# Patient Record
Sex: Male | Born: 1985 | Race: White | Hispanic: No | Marital: Married | Smoking: Never smoker
Health system: Southern US, Community
[De-identification: ages and names within clinical notes are randomized; demographics above are authoritative.]

## PROBLEM LIST (undated history)

## (undated) DIAGNOSIS — I639 Cerebral infarction, unspecified: Secondary | ICD-10-CM

## (undated) DIAGNOSIS — J45909 Unspecified asthma, uncomplicated: Secondary | ICD-10-CM

## (undated) HISTORY — PX: MANDIBLE SURGERY: SHX707

---

## 2021-01-04 ENCOUNTER — Other Ambulatory Visit: Payer: Self-pay

## 2021-01-04 ENCOUNTER — Emergency Department (HOSPITAL_COMMUNITY): Payer: BC Managed Care – PPO

## 2021-01-04 ENCOUNTER — Encounter (HOSPITAL_COMMUNITY): Payer: Self-pay | Admitting: Emergency Medicine

## 2021-01-04 ENCOUNTER — Inpatient Hospital Stay (HOSPITAL_COMMUNITY)
Admission: EM | Admit: 2021-01-04 | Discharge: 2021-01-06 | DRG: 065 | Disposition: A | Discharge status: Home / Self Care | Payer: BC Managed Care – PPO | Attending: Internal Medicine | Admitting: Internal Medicine

## 2021-01-04 DIAGNOSIS — I10 Essential (primary) hypertension: Secondary | ICD-10-CM | POA: Diagnosis present

## 2021-01-04 DIAGNOSIS — I639 Cerebral infarction, unspecified: Secondary | ICD-10-CM | POA: Diagnosis not present

## 2021-01-04 DIAGNOSIS — K219 Gastro-esophageal reflux disease without esophagitis: Secondary | ICD-10-CM | POA: Diagnosis present

## 2021-01-04 DIAGNOSIS — R471 Dysarthria and anarthria: Secondary | ICD-10-CM | POA: Diagnosis not present

## 2021-01-04 DIAGNOSIS — Z79899 Other long term (current) drug therapy: Secondary | ICD-10-CM

## 2021-01-04 DIAGNOSIS — Z20822 Contact with and (suspected) exposure to covid-19: Secondary | ICD-10-CM | POA: Diagnosis not present

## 2021-01-04 DIAGNOSIS — Q2112 Patent foramen ovale: Secondary | ICD-10-CM

## 2021-01-04 DIAGNOSIS — R29701 NIHSS score 1: Secondary | ICD-10-CM | POA: Diagnosis present

## 2021-01-04 DIAGNOSIS — R4781 Slurred speech: Secondary | ICD-10-CM | POA: Diagnosis not present

## 2021-01-04 DIAGNOSIS — R29898 Other symptoms and signs involving the musculoskeletal system: Secondary | ICD-10-CM | POA: Diagnosis present

## 2021-01-04 DIAGNOSIS — Z9889 Other specified postprocedural states: Secondary | ICD-10-CM | POA: Diagnosis not present

## 2021-01-04 DIAGNOSIS — J4520 Mild intermittent asthma, uncomplicated: Secondary | ICD-10-CM

## 2021-01-04 DIAGNOSIS — Z7982 Long term (current) use of aspirin: Secondary | ICD-10-CM

## 2021-01-04 DIAGNOSIS — E7849 Other hyperlipidemia: Secondary | ICD-10-CM | POA: Diagnosis not present

## 2021-01-04 DIAGNOSIS — I6389 Other cerebral infarction: Secondary | ICD-10-CM | POA: Diagnosis not present

## 2021-01-04 DIAGNOSIS — E785 Hyperlipidemia, unspecified: Secondary | ICD-10-CM | POA: Diagnosis not present

## 2021-01-04 DIAGNOSIS — R569 Unspecified convulsions: Secondary | ICD-10-CM | POA: Diagnosis not present

## 2021-01-04 DIAGNOSIS — R9431 Abnormal electrocardiogram [ECG] [EKG]: Secondary | ICD-10-CM | POA: Diagnosis not present

## 2021-01-04 DIAGNOSIS — J45909 Unspecified asthma, uncomplicated: Secondary | ICD-10-CM | POA: Diagnosis not present

## 2021-01-04 HISTORY — DX: Unspecified asthma, uncomplicated: J45.909

## 2021-01-04 LAB — CBC
HCT: 44.7 % (ref 39.0–52.0)
Hemoglobin: 14.4 g/dL (ref 13.0–17.0)
MCH: 26.4 pg (ref 26.0–34.0)
MCHC: 32.2 g/dL (ref 30.0–36.0)
MCV: 81.9 fL (ref 80.0–100.0)
Platelets: 301 10*3/uL (ref 150–400)
RBC: 5.46 MIL/uL (ref 4.22–5.81)
RDW: 12.4 % (ref 11.5–15.5)
WBC: 6.7 10*3/uL (ref 4.0–10.5)
nRBC: 0 % (ref 0.0–0.2)

## 2021-01-04 LAB — COMPREHENSIVE METABOLIC PANEL
ALT: 37 U/L (ref 0–44)
AST: 25 U/L (ref 15–41)
Albumin: 4.3 g/dL (ref 3.5–5.0)
Alkaline Phosphatase: 65 U/L (ref 38–126)
Anion gap: 8 (ref 5–15)
BUN: 15 mg/dL (ref 6–20)
CO2: 27 mmol/L (ref 22–32)
Calcium: 9.2 mg/dL (ref 8.9–10.3)
Chloride: 104 mmol/L (ref 98–111)
Creatinine, Ser: 1.02 mg/dL (ref 0.61–1.24)
GFR, Estimated: >60 mL/min (ref >60)
Glucose, Bld: 94 mg/dL (ref 70–99)
Potassium: 3.7 mmol/L (ref 3.5–5.1)
Sodium: 139 mmol/L (ref 135–145)
Total Bilirubin: 0.8 mg/dL (ref 0.3–1.2)
Total Protein: 7 g/dL (ref 6.5–8.1)

## 2021-01-04 LAB — ETHANOL: Alcohol, Ethyl (B): <10 mg/dL (ref <10)

## 2021-01-04 LAB — URINALYSIS, ROUTINE W REFLEX MICROSCOPIC
Bilirubin Urine: NEGATIVE
Glucose, UA: NEGATIVE mg/dL
Hgb urine dipstick: NEGATIVE
Ketones, ur: NEGATIVE mg/dL
Leukocytes,Ua: NEGATIVE
Nitrite: NEGATIVE
Protein, ur: NEGATIVE mg/dL
Specific Gravity, Urine: 1.005 (ref 1.005–1.030)
pH: 7 (ref 5.0–8.0)

## 2021-01-04 LAB — I-STAT CHEM 8, ED
BUN: 21 mg/dL — ABNORMAL HIGH (ref 6–20)
Calcium, Ion: 1.18 mmol/L (ref 1.15–1.40)
Chloride: 102 mmol/L (ref 98–111)
Creatinine, Ser: 0.9 mg/dL (ref 0.61–1.24)
Glucose, Bld: 89 mg/dL (ref 70–99)
HCT: 46 % (ref 39.0–52.0)
Hemoglobin: 15.6 g/dL (ref 13.0–17.0)
Potassium: 3.6 mmol/L (ref 3.5–5.1)
Sodium: 140 mmol/L (ref 135–145)
TCO2: 27 mmol/L (ref 22–32)

## 2021-01-04 LAB — RESP PANEL BY RT-PCR (FLU A&B, COVID) ARPGX2
Influenza A by PCR: NEGATIVE
Influenza B by PCR: NEGATIVE
SARS Coronavirus 2 by RT PCR: NEGATIVE

## 2021-01-04 LAB — CBG MONITORING, ED: Glucose-Capillary: 107 mg/dL — ABNORMAL HIGH (ref 70–99)

## 2021-01-04 LAB — DIFFERENTIAL
Abs Immature Granulocytes: 0.02 10*3/uL (ref 0.00–0.07)
Basophils Absolute: 0 10*3/uL (ref 0.0–0.1)
Basophils Relative: 0 %
Eosinophils Absolute: 0.1 10*3/uL (ref 0.0–0.5)
Eosinophils Relative: 1 %
Immature Granulocytes: 0 %
Lymphocytes Relative: 21 %
Lymphs Abs: 1.4 10*3/uL (ref 0.7–4.0)
Monocytes Absolute: 0.5 10*3/uL (ref 0.1–1.0)
Monocytes Relative: 8 %
Neutro Abs: 4.6 10*3/uL (ref 1.7–7.7)
Neutrophils Relative %: 70 %

## 2021-01-04 LAB — APTT: aPTT: 31 seconds (ref 24–36)

## 2021-01-04 LAB — RAPID URINE DRUG SCREEN, HOSP PERFORMED
Amphetamines: NOT DETECTED
Barbiturates: NOT DETECTED
Benzodiazepines: NOT DETECTED
Cocaine: NOT DETECTED
Opiates: NOT DETECTED
Tetrahydrocannabinol: NOT DETECTED

## 2021-01-04 LAB — PROTIME-INR
INR: 0.9 (ref 0.8–1.2)
Prothrombin Time: 12.5 seconds (ref 11.4–15.2)

## 2021-01-04 IMAGING — MR MR HEAD WO/W CM
10 of 12 series · 34 of 48 positions shown · IV contrast (gadavist)
Comparison: None.

CLINICAL DATA: Neuro deficit, acute, stroke suspected

EXAM:
MRI HEAD WITHOUT AND WITH CONTRAST
TECHNIQUE: Multiplanar, multiecho pulse sequences of the brain and surrounding
structures were obtained without and with intravenous contrast.
CONTRAST:  7mL GADAVIST GADOBUTROL 1 MMOL/ML IV SOLN

[Series 3: DWI · axial · 3.0mm · 1.09mm/px · z∈[-105,+56]mm · 8 of 112 slices shown (1 of 4)]
[im 1/112]
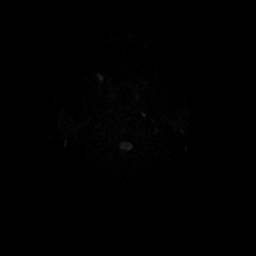
[im 13/112]
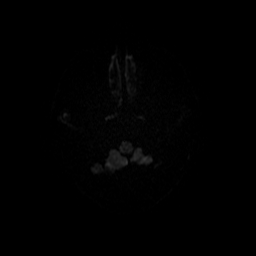
[im 38/112]
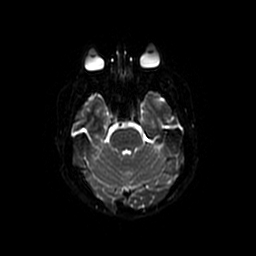
[im 50/112]
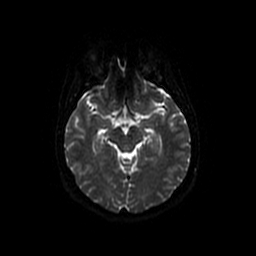
[im 62/112]
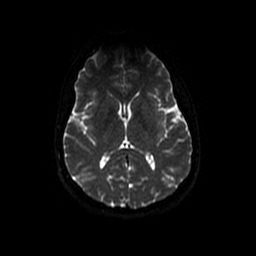
[im 75/112]
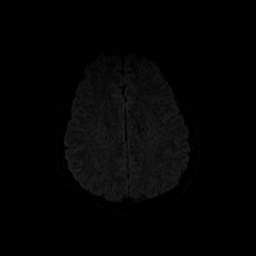
[im 99/112]
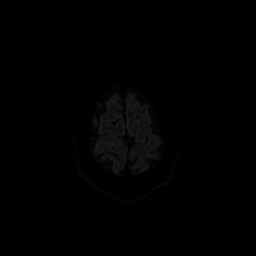
[im 112/112]
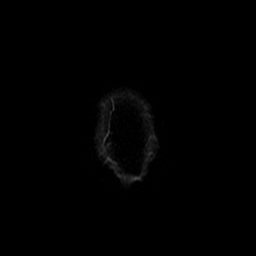

[Series 4: DWI · coronal · 5.0mm · 1.09mm/px · 6 of 74 slices shown (2 of 4)]
[im 1/74]
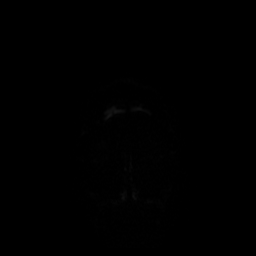
[im 15/74]
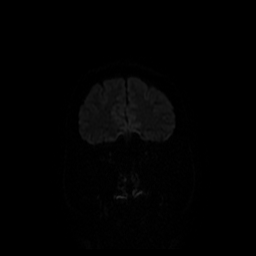
[im 30/74]
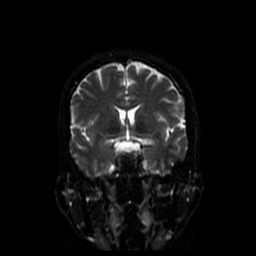
[im 44/74]
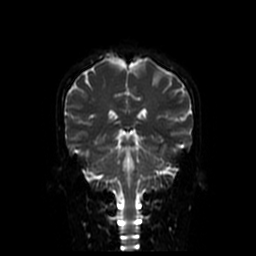
[im 59/74]
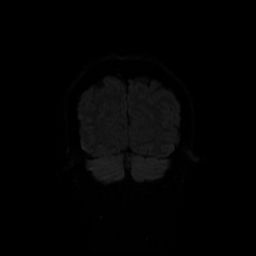
[im 74/74]
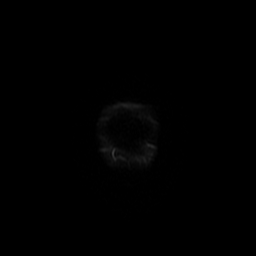

[Series 5: T1 · sagittal · 5.0mm · 0.47mm/px · 1 of 25 slices shown]
[im 1/25]
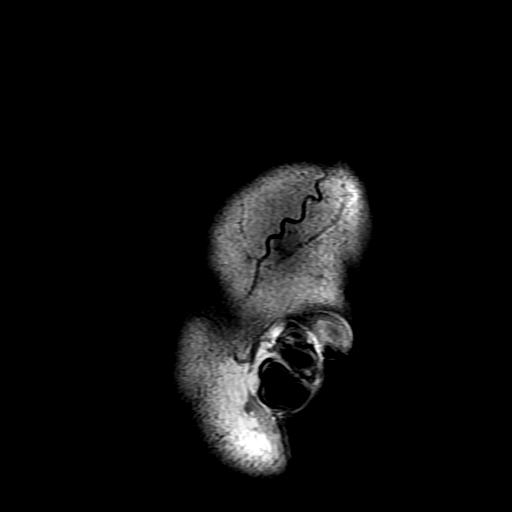

[Series 6: T2 · axial · 5.0mm · 0.43mm/px · z∈[-95,+62]mm · 2 of 28 slices shown]
[im 1/28]
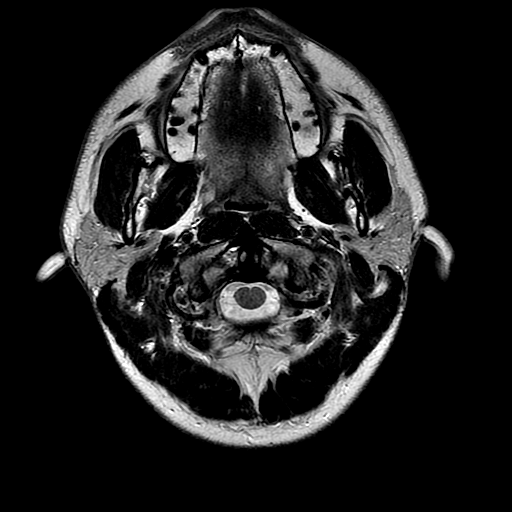
[im 28/28]
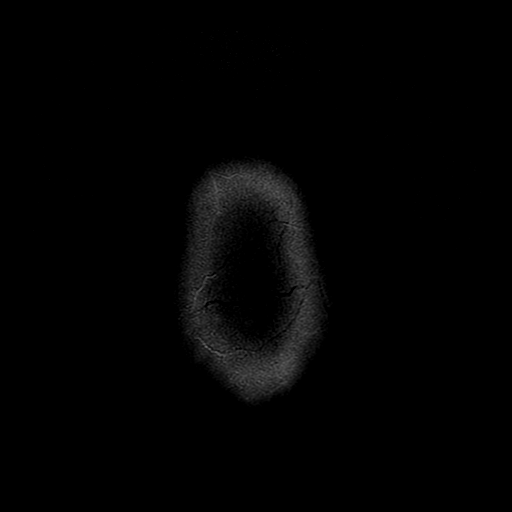

[Series 7: FLAIR · axial · 3.0mm · 0.43mm/px · z∈[-95,+62]mm · 2 of 28 slices shown]
[im 1/28]
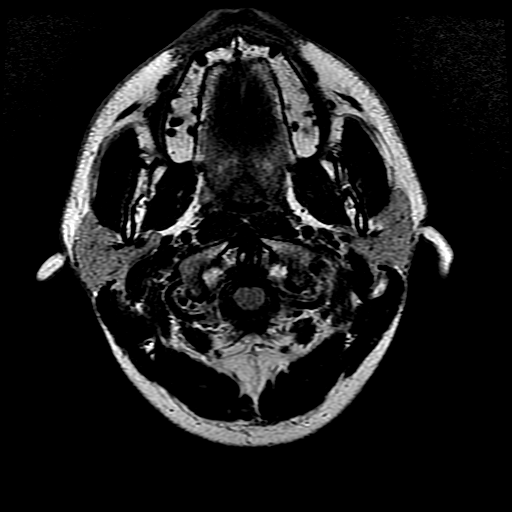
[im 28/28]
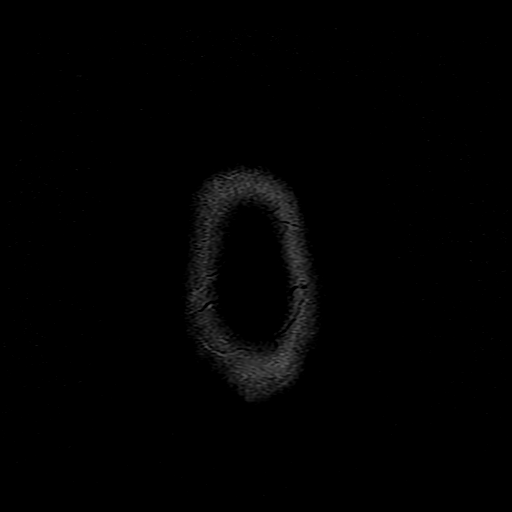

[Series 10: T2 post-contrast · coronal · 5.0mm · 0.39mm/px · 2 of 28 slices shown]
[im 1/28]
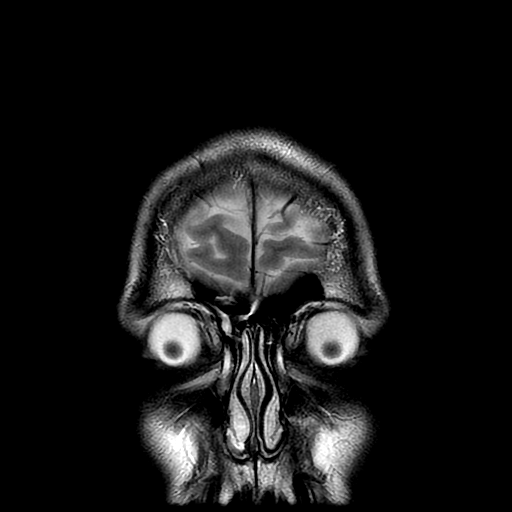
[im 28/28]
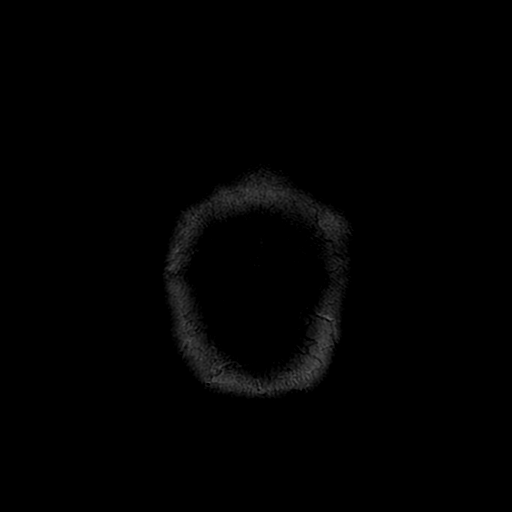

[Series 11: T1 post-contrast · axial · 3.0mm · 0.47mm/px · z∈[-99,+61]mm · 4 of 56 slices shown (1 of 2)]
[im 1/56]
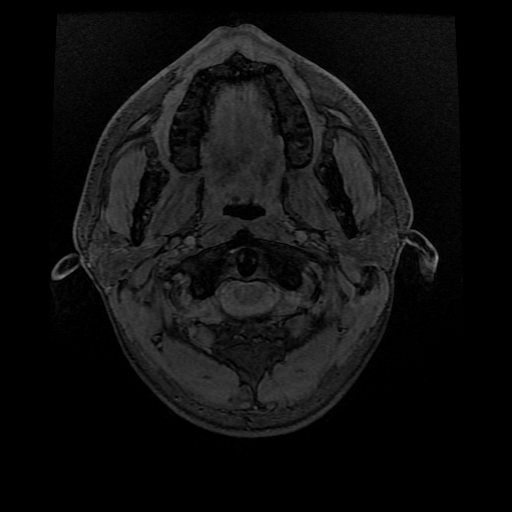
[im 19/56]
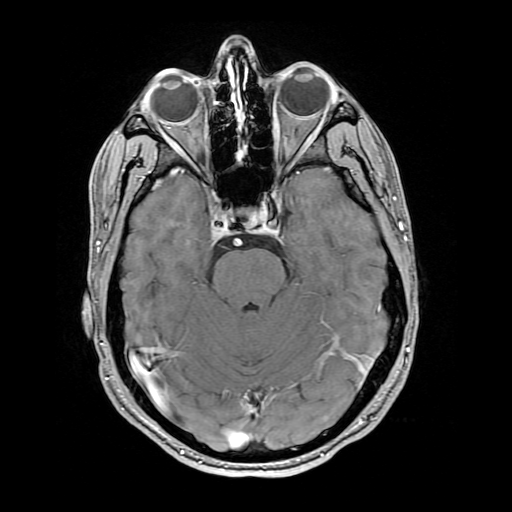
[im 37/56]
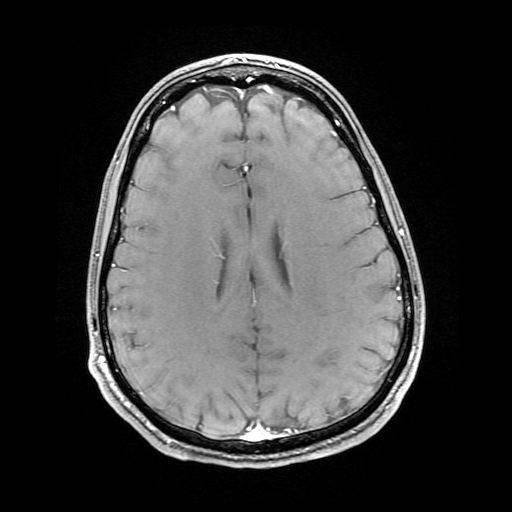
[im 56/56]
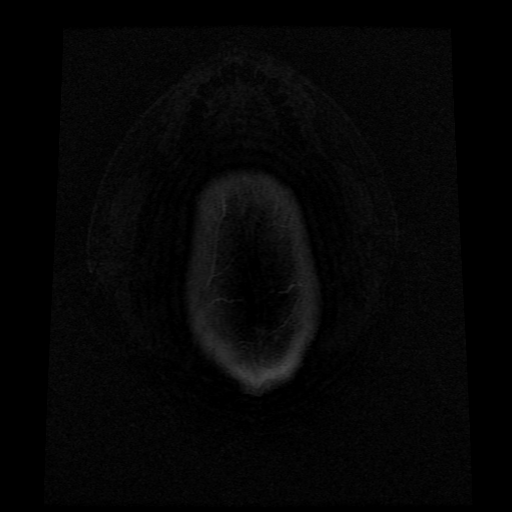

[Series 12: T1 post-contrast · coronal · 5.0mm · 0.39mm/px · 2 of 28 slices shown (2 of 2)]
[im 1/28]
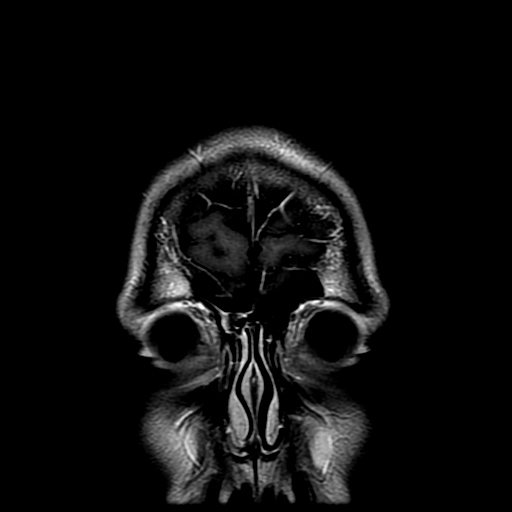
[im 28/28]
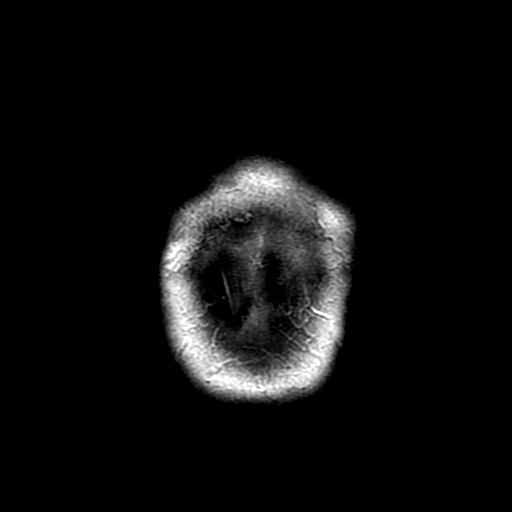

[Series 300: DWI · axial · 3.0mm · 1.09mm/px · z∈[-105,+56]mm · 4 of 56 slices shown (3 of 4)]
[im 1/56]
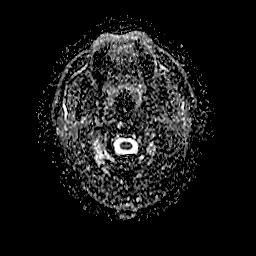
[im 19/56]
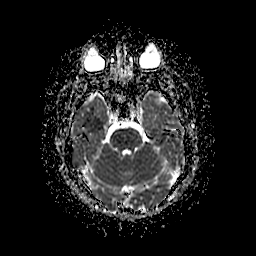
[im 37/56]
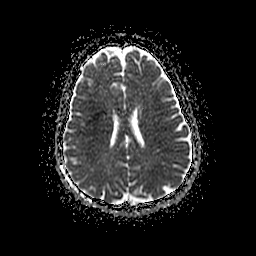
[im 56/56]
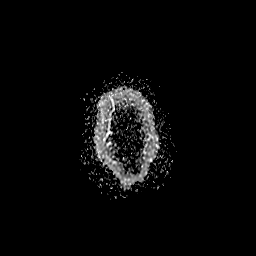

[Series 400: DWI · coronal · 5.0mm · 1.09mm/px · 3 of 37 slices shown (4 of 4)]
[im 1/37]
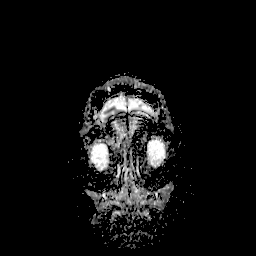
[im 19/37]
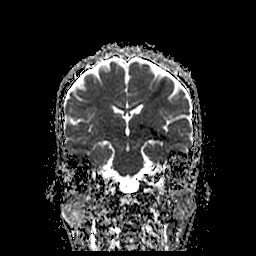
[im 37/37]
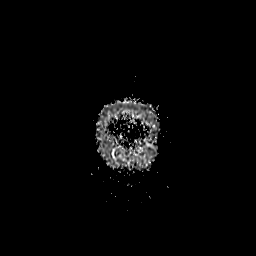

[34 of 48 positions shown; findings below may reference images not displayed]

FINDINGS: Brain: Small area of reduced diffusion in the left frontal lobe
involving the left precentral gyrus this lateral to hand motor
region and extending into the centrum semiovale. No evidence of
intracranial hemorrhage. There is no intracranial mass, mass effect,
or edema. There is no hydrocephalus or extra-axial fluid collection.
Ventricles and sulci are normal in size and configuration. No
abnormal enhancement.

Vascular: Major vessel flow voids at the skull base are preserved.

Skull and upper cervical spine: Normal marrow signal is preserved.

Sinuses/Orbits: Minor mucosal thickening.  Orbits are unremarkable.

Other: Sella is unremarkable.  Mastoid air cells are clear.
IMPRESSION: Small acute infarct of the left frontal lobe.

## 2021-01-04 IMAGING — CT CT ANGIO HEAD-NECK (W OR W/O PERF)
2 of 11 series · 8 of 33 positions shown · IV contrast (omnipaque)
Comparison: None.

CLINICAL DATA: Spasms, slurred speech

EXAM:
CT ANGIOGRAPHY HEAD AND NECK
TECHNIQUE: Multidetector CT imaging of the head and neck was performed using
the standard protocol during bolus administration of intravenous
contrast. Multiplanar CT image reconstructions and MIPs were
obtained to evaluate the vascular anatomy. Carotid stenosis
measurements (when applicable) are obtained utilizing NASCET
criteria, using the distal internal carotid diameter as the
denominator.
CONTRAST:  70mL OMNIPAQUE IOHEXOL 350 MG/ML SOLN

[Series 5: cta neck · axial · 0.44mm/px · z∈[-300,+80]mm · 3 of 191 slices shown]
[im 1/191  soft-tissue]
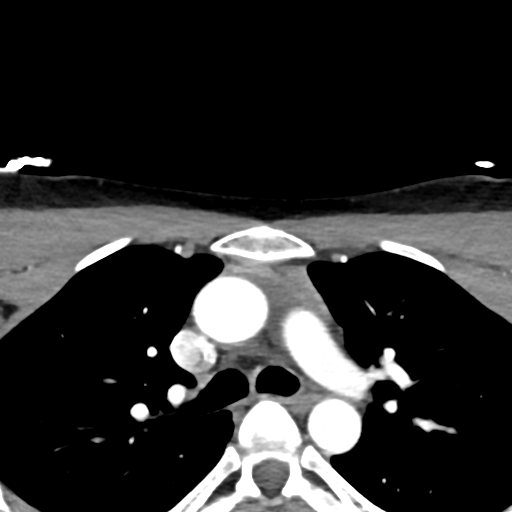
[im 96/191  bone]
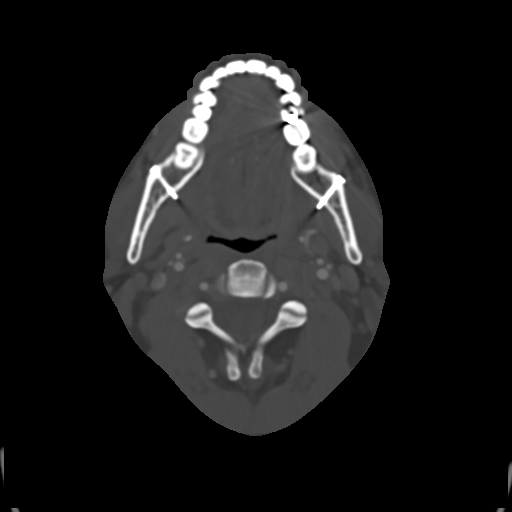
[im 191/191  soft-tissue]
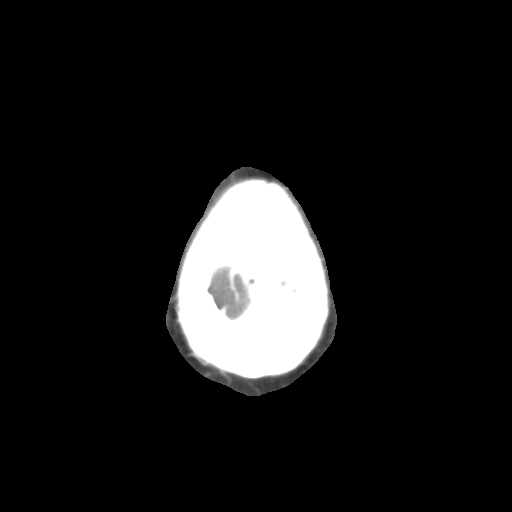

[Series 11: cta neck axial · axial · 0.39mm/px · z∈[-236,+16]mm · 5 of 379 slices shown]
[im 64/379  soft-tissue]
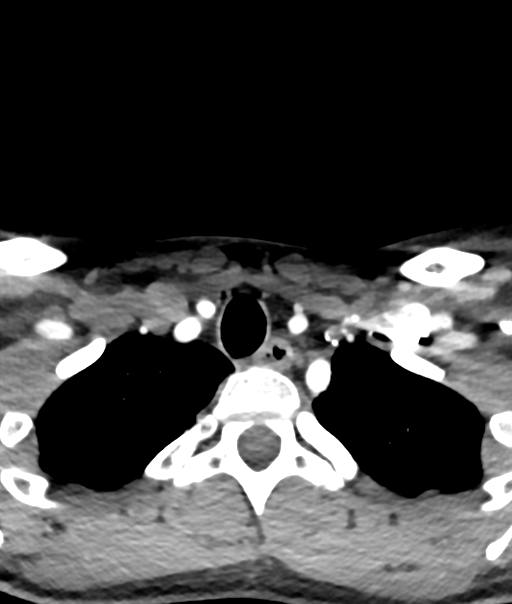
[im 127/379  soft-tissue]
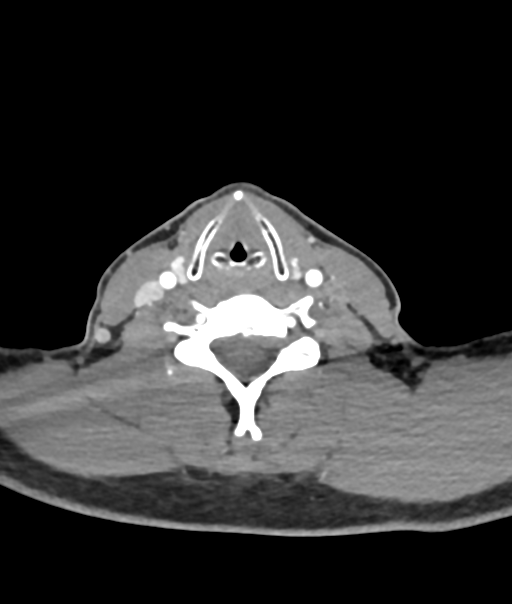
[im 190/379  soft-tissue]
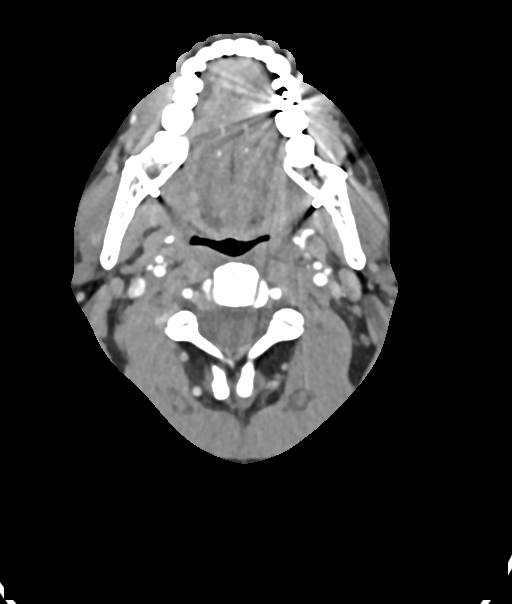
[im 253/379  soft-tissue]
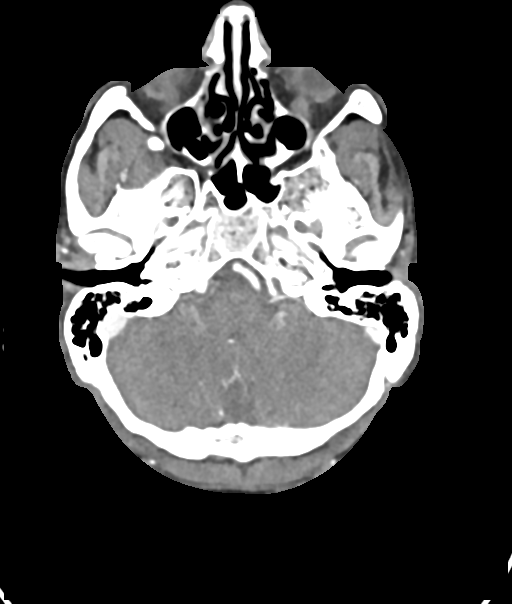
[im 316/379  soft-tissue]
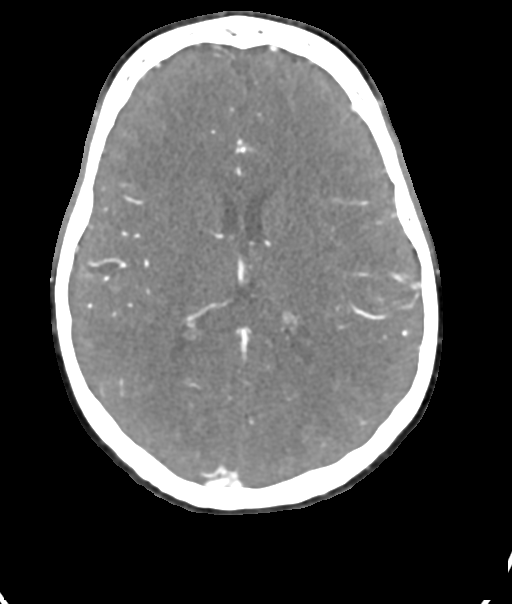

[8 of 33 positions shown; findings below may reference images not displayed]

FINDINGS: CT HEAD FINDINGS

Brain: There is no acute intracranial hemorrhage, extra-axial fluid
collection, or acute infarct.

Parenchymal volume is normal. The ventricles are normal in size.
Gray-white differentiation is preserved.

There is no mass lesion.  There is no midline shift.

Vascular: No hyperdense vessel or unexpected calcification.

Skull: Normal. Negative for fracture or focal lesion.

Sinuses: There is minimal mucosal thickening in the maxillary
sinuses.

Orbits: The globes and orbits are unremarkable.

Review of the MIP images confirms the above findings

CTA NECK FINDINGS

Aortic arch: The aortic arch is normal. The origins of the major
branch vessels are patent.

Right carotid system: The right common, internal, and external
carotid arteries are patent, without hemodynamically significant
stenosis or occlusion. There is no dissection or aneurysm.

Left carotid system: The left common, internal, and external carotid
arteries are patent, without hemodynamically significant stenosis or
occlusion. There is no dissection or aneurysm.

Vertebral arteries: The vertebral arteries are codominant. The
vertebral arteries are patent, without hemodynamically significant
stenosis or occlusion. There is no dissection or aneurysm.

Skeleton: There is no acute osseous abnormality or aggressive
osseous lesion. Postsurgical changes are noted in the mandible
bilaterally.

Other neck: The soft tissues are unremarkable.

Upper chest: The imaged lung apices are clear.

Review of the MIP images confirms the above findings

CTA HEAD FINDINGS

Anterior circulation: The intracranial ICAs are patent.

The bilateral MCAs are patent.

The bilateral ACAs are patent. The anterior communicating artery is
patent.

There is no aneurysm.

Posterior circulation: The bilateral V4 segments are patent. PICA is
patent bilaterally. The basilar artery is patent.

The bilateral PCAs are patent. The posterior communicating arteries
are not identified.

There is no aneurysm.

Venous sinuses: Patent.

Anatomic variants: None.

Review of the MIP images confirms the above findings
IMPRESSION: 1. No acute intracranial pathology.
2. Normal CTA of the head and neck.

## 2021-01-04 MED ORDER — CLOPIDOGREL BISULFATE 75 MG PO TABS
75.0000 mg | ORAL_TABLET | Freq: Every day | ORAL | Status: DC
  Administered 2021-01-04 – 2021-01-06 (×3): 75 mg via ORAL
  Filled 2021-01-04 (×3): qty 1

## 2021-01-04 MED ORDER — IOHEXOL 350 MG/ML SOLN
70.0000 mL | Freq: Once | INTRAVENOUS | Status: AC | PRN
  Administered 2021-01-04: 12:00:00 70 mL via INTRAVENOUS

## 2021-01-04 MED ORDER — GADOBUTROL 1 MMOL/ML IV SOLN
7.0000 mL | Freq: Once | INTRAVENOUS | Status: AC | PRN
  Administered 2021-01-04: 15:00:00 7 mL via INTRAVENOUS

## 2021-01-04 NOTE — Progress Notes (Signed)
Received from ED.  Oriented to room and surroundings.  No complaints.  Stroke education begun.

## 2021-01-04 NOTE — Assessment & Plan Note (Addendum)
Stable medical issue.  Uses an inhaler perhaps once or twice a year.

## 2021-01-04 NOTE — Progress Notes (Signed)
[facility] night cross cover note:  I was notified by RN that patient has passed nursing bedside swallow screen.  No reported history of underlying diabetes.  Subsequently, I placed order for initiation of regular diet.    Newton Pigg, DO Hospitalist

## 2021-01-04 NOTE — ED Triage Notes (Signed)
Pt reports he woke up around 8:50 this morning, had involuntary contracting of his right arm. Pt states he went to yell for his wife to help him and felt his speech was slurred. Pt states his symptoms have resolved at this time. Pt reports increased "tremors" while working. PT also reports episode of blurry vision two weeks ago while watching TV.  VSS. No droop, drift, slurred speech noted at present. VAN negative.

## 2021-01-04 NOTE — Assessment & Plan Note (Signed)
Secondary to CVA.  Resolved. °

## 2021-01-04 NOTE — H&P (Addendum)
History and Physical    Patient: Jimmy Kim:[id number] DOB: 1985/12/05 DOA: 01/04/2021 DOS: the patient was seen and examined on 01/04/2021 PCP: Tally Joe, MD  Patient coming from: Home  Chief Complaint: Slurred speech and right arm weakness  HPI: Jimmy Kim is a 35 y.o. male with medical history significant of asthma well-controlled who overall is quite healthy and first noticed some issues with his left arm at Thanksgiving where for less than a minute he had 2 separate instances where he had some left arm contractures.  He did not think too much of this.  Then approximately 3 weeks ago, he also had an episode of blurry vision which again lasted less than a minute.  His child recently had the flu and he thought perhaps he was getting the flu.  Then today, patient woke up and noted that his right arm was very weak as well as when he called out for his wife, felt like his speech was slurred.  These episodes lasted for about a minute and then resolved.  He became concerned and then came to the emergency room.  In the emergency room, initial vitals were unremarkable although diastolic blood pressure did trend upward as the day progressed.  MRI however noted a acute CVA in the left precentral gyrus.  Hospitalist were called for admission.  Neurology was called for consult.  Review of Systems: Patient denies any headache, vision changes, dysphagia, chest pain, palpitations, shortness of breath, wheeze, cough, abdominal pain, hematuria, dysuria, constipation with diarrhea, focal extremity numbness weakness or pain.  His review of systems are currently negative.  Past Medical History:  Diagnosis Date   Asthma    Past Surgical History:  Procedure Laterality Date   MANDIBLE SURGERY     Social History:  reports that he has never smoked. He has never used smokeless tobacco. He reports that he does not drink alcohol and does not use drugs.  He does not drink any energy drinks.  He thinks  that maybe he had a caffeinated drink about a year ago.  Patient is able to ambulate without assistance.  Allergies  Allergen Reactions   Other Other (See Comments)    Quinoa - intolerance    History reviewed. No pertinent family history.  Specifically asked, patient's mother had a DVT during pregnancy, but not otherwise.  No other history of instances of PE or DVT.  No other history or instances of CVA.  Prior to Admission medications   Medication Sig Start Date End Date Taking? Authorizing Provider  albuterol (VENTOLIN HFA) 108 (90 Base) MCG/ACT inhaler Inhale 2 puffs into the lungs every 6 (six) hours as needed for wheezing or shortness of breath.   Yes [provider]  ibuprofen (ADVIL) 200 MG tablet Take 200 mg by mouth every 6 (six) hours as needed for moderate pain.   Yes [provider]  Melatonin 2.5 MG CHEW Chew 2.5 mg by mouth daily as needed (sleep).   Yes [provider]  mometasone (NASONEX) 50 MCG/ACT nasal spray Place 1 spray into the nose daily as needed (nasal congestion).   Yes [provider]  OVER THE COUNTER MEDICATION Place 2 drops into both ears daily as needed (for ear pain). Similasan Ear ache relief -Homeopathic medicine   Yes [provider]    Physical Exam: Vitals:   01/04/21 1530 01/04/21 1600 01/04/21 1730 01/04/21 1830  BP: 113/73 124/80 (!) 129/91 117/83  Pulse: 63 61 64 66  Resp: 13  12 13 12   Temp:      TempSrc:      SpO2: 98% 100% 100% 99%  Weight:      Height:       General: Alert and oriented x3, no acute distress HEENT: Normocephalic and atraumatic, mucous membranes are moist.  Cranial nerves II through XII appear intact. Neck: Supple, no JVD, no carotid bruits Cardiovascular: Regular rate and rhythm, S1-S2, no murmurs Lungs: Clear to auscultation bilaterally Abdomen: Soft, nontender, nondistended, positive bowel sounds Extremities: No clubbing or cyanosis or edema Neuro: No focal deficits.   Sensation appears intact.  No dysmetria.  Toes are downgoing.  Finger-to-nose normal bilaterally.  Patient had some mild weakness on the left upper extremity, but this was due to placement of the IV which limited his ability to flex his arm. Psychiatry: Patient is appropriate, no evidence of psychoses Skin: No skin breaks, tears or lesions  Data Reviewed: My review of labs, imaging, notes and other tests shows no new significant findings.  No electrolyte abnormalities.  I reviewed the MRI.  Assessment/Plan Cardiovascular and Mediastinum * Acute CVA (cerebrovascular accident) [facility]) Assessment & Plan Curious in a patient with no risk factors.  He did however travel to [facility] biplane in October and then symptoms started approximately a month later.  This can be concerning that he may have a DVT in his lower extremities and this is a patent PFO causing CVA.  He did not appear to have any other risk factors.  No family or genetic history.  Appreciate neurology help.  We will do full work-up including lower extremity Dopplers, 2D echo with bubble study as well as check risk factors for CVA including carotids, A1c and cholesterol.  Patient started on aspirin and Plavix.  If work-up is negative, he will likely need a 30-day event monitor as well.  Respiratory Asthma Assessment & Plan Patient has never really required hospitalization.  Uses an inhaler perhaps once or twice a year.  Other Dysarthria Assessment & Plan Secondary to CVA.  Resolved.      Advance Care Planning:   Code Status: Not on file full code  Consults: Neurology  Family Communication: Wife at the bedside  Severity of Illness: The appropriate patient status for this patient is inpatient.  He is expected to be here likely past 2 midnights and require acute hospital services for confirmed diagnosis.  Author: Annita Brod 01/04/2021 7:24 PM  For on call review [email or web address].

## 2021-01-04 NOTE — Consult Note (Signed)
Neurology Consult H&P  Jimmy Kim MR# [id number] 01/04/2021   CC: acute stroke  History is obtained from: patient, wife and chart.  HPI: Jimmy Kim is a 35 y.o. male PMHx as reviewed below, otherwise healthy had uncontrollable, involuntary writhing of his right arm and hand followed by slurred speech which began around 0930 this morning which lasted ~1 minute. He had a somewhat similar episode in character and duration without slurred speech ~about a month ago. Prior to that he had an episode of blurred vision which lasted about several minutes which completely resolved.   He reports that he had flu approximately 3 weeks ago which was presumed due to his child being flu positive.  LKW: 0930 tNK given: No low NIHSS IR Thrombectomy No indication Modified Rankin Scale: 0-Completely asymptomatic and back to baseline post- stroke NIHSS: 0  ROS: A complete ROS was performed and is negative except as noted in the HPI.   Past Medical History:  Diagnosis Date   Asthma    History reviewed. No pertinent family history.  Social History:  reports that he has never smoked. He has never used smokeless tobacco. He reports that he does not drink alcohol and does not use drugs.   Prior to Admission medications   Medication Sig Start Date End Date Taking? Authorizing Provider  albuterol (VENTOLIN HFA) 108 (90 Base) MCG/ACT inhaler Inhale 2 puffs into the lungs every 6 (six) hours as needed for wheezing or shortness of breath.   Yes [provider]  ibuprofen (ADVIL) 200 MG tablet Take 200 mg by mouth every 6 (six) hours as needed for moderate pain.   Yes [provider]  Melatonin 2.5 MG CHEW Chew 2.5 mg by mouth daily as needed (sleep).   Yes [provider]  mometasone (NASONEX) 50 MCG/ACT nasal spray Place 1 spray into the nose daily as needed (nasal congestion).   Yes [provider]  OVER THE COUNTER MEDICATION Place 2 drops into both ears daily as  needed (for ear pain). Similasan Ear ache relief -Homeopathic medicine   Yes [provider]   Exam: Current vital signs: BP 124/80    Pulse 61    Temp 98.2 F (36.8 C) (Oral)    Resp 12    Ht 6' (1.829 m)    Wt 79.4 kg    SpO2 100%    BMI 23.73 kg/m   Physical Exam  Constitutional: Appears well-developed and well-nourished.  Psych: Affect appropriate to situation Eyes: No scleral injection HENT: No OP obstruction. Head: Normocephalic.  Cardiovascular: Normal rate and regular rhythm.  Respiratory: Effort normal, symmetric excursions bilaterally, no audible wheezing. GI: Soft.  No distension. There is no tenderness.  Skin: WDI  Neuro: Mental Status: Patient is awake, alert, oriented to person, place, month, year, and situation. Patient is able to give a clear and coherent history. Speech  fluent, intact comprehension and repetition. No signs of aphasia or neglect. Visual Fields are full. Pupils are equal, round, and reactive to light. EOMI without ptosis or diploplia.  Facial sensation is symmetric to temperature Facial movement is symmetric.  Hearing is intact to voice. Uvula midline and palate elevates symmetrically. Shoulder shrug is symmetric. Tongue is midline without atrophy or fasciculations.  Tone is normal. Bulk is normal. 5/5 strength was present in all four extremities. Sensation is decreased to cold along right arm.  Deep Tendon Reflexes: 2+ and symmetric in the biceps and patellae. Toes are downgoing bilaterally. FNF and HKS are  intact bilaterally. Gait - Deferred  I have reviewed labs in epic and the pertinent results are: GLU 139  I have reviewed the images obtained: MRI brain showed small area of reduced diffusion in the left frontal lobe involving the left precentral gyrus this lateral to hand motor region and extending into the centrum semiovale. CTA head and neck normal.   Assessment: Jimmy Kim is a 35 y.o. right handed male no sig PMHx  with acute stroke in left precentral gyrus. He does not have any vascular risk factors, there are no family members with stroke at a younger age or family history of coagulopathy. CTA does not reveal arterial dissection, FMD or other overt vasculopathy. Differential diagnosis of possible etiologies includes congenital heart disease, paradoxical embolism, coagulopathy, inherited metabolic syndrome. He will need stroke workup including hypercoagulable studies.   He reports that he had flu ~3 weeks ago and in the setting of acute stroke and therefore testing for beta2-glycoprotein I Abs, lupus anticoagulant, anticardiolipin Abs may be false positive and he may have to wait at least 12 weeks to have these tests.   Recommended aspirin 328m and clopidogrel 718mnow.  Plan: - Recommend TTE.  - Recommend labs: HbA1c, lipid panel, TSH, ESR, CRP, ANA. - Consider lupus anticoagulant, anticardiolipin, antibeta2-glycoprotein I antibodies (a?2GPI) IgG or IgM and possibly factor V Leiden.  - Recommend Statin if LDL > 70 - Continue aspirin 8184maily for now. - Continue clopidogrel 44m52mily for 3 weeks for now. - Telemetry monitoring for arrhythmia. - Recommend bedside Swallow screen. - Recommend Stroke education. - Recommend PT/OT/SLP consult.  This patient is critically ill and at significant risk of neurological worsening, death and care requires constant monitoring of vital signs, hemodynamics,respiratory and cardiac monitoring, neurological assessment, discussion with family, other specialists and medical decision making of high complexity. I spent 71 minutes of neurocritical care time  in the care of  this patient. This was time spent independent of any time provided by nurse practitioner or PA.  Electronically signed by:  HuntLynnae Sandhoff Page: [id number]/2022, 4:39 PM

## 2021-01-04 NOTE — ED Notes (Signed)
Neuro at bedside.

## 2021-01-04 NOTE — Assessment & Plan Note (Addendum)
Curious in a patient with no risk factors.  He did however travel to [facility] biplane in October and then symptoms started approximately a month later.  This can be concerning that he may have a DVT in his lower extremities and this is a patent PFO causing CVA.  He did not appear to have any other risk factors.  No family or genetic history.  Appreciate neurology help.  CT angiogram of neck notes no significant plaque in carotids bilaterally.  2D echo with bubble study completed-results pending.  Seen by neurology who have ordered EEG and TEE.  TEE cannot be done until Wednesday, 1/4 so patient will have this done as outpatient.  EEG should be done later tonight.  If work-up is negative, he will likely need a 30-day event monitor as well.

## 2021-01-04 NOTE — ED Notes (Signed)
Pt returned from MRI °

## 2021-01-04 NOTE — ED Provider Notes (Signed)
[facility] EMERGENCY DEPARTMENT Provider Note   CSN: [id number] Arrival date & time: 01/04/21  1053     History Chief Complaint  Patient presents with   Spasms    Jimmy Kim is a 35 y.o. male.  HPI 34 year old previously healthy male presents today complaining of episode of right hand/arm movements he was unable to control and slurring of speech.  Symptoms began at about 9:10 AM this morning after he woke up.  That lasted for approximately 1 minute.  He reports that he has had some similar episodes with the left arm without speech issues over the past month both prior episodes of lasted only 2 minutes.  He denies any trauma, headaches, fever, chills.  He reports that he had flu approximately 3 weeks ago which was presumed due to his child being flu positive.     Past Medical History:  Diagnosis Date   Asthma     There are no problems to display for this patient.   Past Surgical History:  Procedure Laterality Date   MANDIBLE SURGERY         History reviewed. No pertinent family history.  Social History   Tobacco Use   Smoking status: Never   Smokeless tobacco: Never  Substance Use Topics   Alcohol use: Never   Drug use: Never    Home Medications Prior to Admission medications   Not on File    Allergies    Patient has no known allergies.  Review of Systems   Review of Systems  All other systems reviewed and are negative.  Physical Exam Updated Vital Signs BP 124/80    Pulse 61    Temp 98.2 F (36.8 C) (Oral)    Resp 12    Ht 1.829 m (6')    Wt 79.4 kg    SpO2 100%    BMI 23.73 kg/m   Physical Exam Vitals and nursing note reviewed.  Constitutional:      General: He is not in acute distress.    Appearance: Normal appearance.  HENT:     Head: Normocephalic and atraumatic.     Right Ear: External ear normal.     Left Ear: External ear normal.     Nose: Nose normal.     Mouth/Throat:     Pharynx: Oropharynx is clear.   Eyes:     Extraocular Movements: Extraocular movements intact.     Conjunctiva/sclera: Conjunctivae normal.     Pupils: Pupils are equal, round, and reactive to light.  Cardiovascular:     Rate and Rhythm: Normal rate and regular rhythm.     Pulses: Normal pulses.  Pulmonary:     Effort: Pulmonary effort is normal.     Breath sounds: Normal breath sounds.  Abdominal:     General: Abdomen is flat.     Palpations: Abdomen is soft.  Musculoskeletal:        General: Normal range of motion.     Cervical back: Normal range of motion.  Skin:    General: Skin is warm and dry.     Capillary Refill: Capillary refill takes less than 2 seconds.  Neurological:     General: No focal deficit present.     Mental Status: He is alert.     Cranial Nerves: No cranial nerve deficit.     Sensory: No sensory deficit.     Motor: No weakness.     Coordination: Coordination normal.     Gait: Gait normal.  Deep Tendon Reflexes: Reflexes normal.  Psychiatric:        Mood and Affect: Mood normal.        Behavior: Behavior normal.    ED Results / Procedures / Treatments   Labs (all labs ordered are listed, but only abnormal results are displayed) Labs Reviewed  URINALYSIS, ROUTINE W REFLEX MICROSCOPIC - Abnormal; Notable for the following components:      Result Value   Color, Urine STRAW (*)    All other components within normal limits  CBG MONITORING, ED - Abnormal; Notable for the following components:   Glucose-Capillary 107 (*)    All other components within normal limits  I-STAT CHEM 8, ED - Abnormal; Notable for the following components:   BUN 21 (*)    All other components within normal limits  RESP PANEL BY RT-PCR (FLU A&B, COVID) ARPGX2  ETHANOL  PROTIME-INR  APTT  CBC  DIFFERENTIAL  COMPREHENSIVE METABOLIC PANEL  RAPID URINE DRUG SCREEN, HOSP PERFORMED    EKG EKG Interpretation  Date/Time:  Friday January 04 2021 11:06:00 EST Ventricular Rate:  82 PR  Interval:  162 QRS Duration: 84 QT Interval:  372 QTC Calculation: 434 R Axis:   11 Text Interpretation: Normal sinus rhythm with sinus arrhythmia Normal ECG No previous ECGs available Confirmed by Margarita Grizzle [phone]) on 01/04/2021 12:33:16 PM  Radiology CT ANGIO HEAD NECK W WO CM  Result Date: 01/04/2021 CLINICAL DATA:  Spasms, slurred speech EXAM: CT ANGIOGRAPHY HEAD AND NECK TECHNIQUE: Multidetector CT imaging of the head and neck was performed using the standard protocol during bolus administration of intravenous contrast. Multiplanar CT image reconstructions and MIPs were obtained to evaluate the vascular anatomy. Carotid stenosis measurements (when applicable) are obtained utilizing NASCET criteria, using the distal internal carotid diameter as the denominator. CONTRAST:  39mL OMNIPAQUE IOHEXOL 350 MG/ML SOLN COMPARISON:  None. FINDINGS: CT HEAD FINDINGS Brain: There is no acute intracranial hemorrhage, extra-axial fluid collection, or acute infarct. Parenchymal volume is normal. The ventricles are normal in size. Gray-white differentiation is preserved. There is no mass lesion.  There is no midline shift. Vascular: No hyperdense vessel or unexpected calcification. Skull: Normal. Negative for fracture or focal lesion. Sinuses: There is minimal mucosal thickening in the maxillary sinuses. Orbits: The globes and orbits are unremarkable. Review of the MIP images confirms the above findings CTA NECK FINDINGS Aortic arch: The aortic arch is normal. The origins of the major branch vessels are patent. Right carotid system: The right common, internal, and external carotid arteries are patent, without hemodynamically significant stenosis or occlusion. There is no dissection or aneurysm. Left carotid system: The left common, internal, and external carotid arteries are patent, without hemodynamically significant stenosis or occlusion. There is no dissection or aneurysm. Vertebral arteries: The vertebral  arteries are codominant. The vertebral arteries are patent, without hemodynamically significant stenosis or occlusion. There is no dissection or aneurysm. Skeleton: There is no acute osseous abnormality or aggressive osseous lesion. Postsurgical changes are noted in the mandible bilaterally. Other neck: The soft tissues are unremarkable. Upper chest: The imaged lung apices are clear. Review of the MIP images confirms the above findings CTA HEAD FINDINGS Anterior circulation: The intracranial ICAs are patent. The bilateral MCAs are patent. The bilateral ACAs are patent. The anterior communicating artery is patent. There is no aneurysm. Posterior circulation: The bilateral V4 segments are patent. PICA is patent bilaterally. The basilar artery is patent. The bilateral PCAs are patent. The posterior communicating arteries are not identified.  There is no aneurysm. Venous sinuses: Patent. Anatomic variants: None. Review of the MIP images confirms the above findings IMPRESSION: 1. No acute intracranial pathology. 2. Normal CTA of the head and neck. Electronically Signed   By: Lesia Hausen M.D.   On: 01/04/2021 12:15   MR Brain W and Wo Contrast  Result Date: 01/04/2021 CLINICAL DATA:  Neuro deficit, acute, stroke suspected EXAM: MRI HEAD WITHOUT AND WITH CONTRAST TECHNIQUE: Multiplanar, multiecho pulse sequences of the brain and surrounding structures were obtained without and with intravenous contrast. CONTRAST:  74mL GADAVIST GADOBUTROL 1 MMOL/ML IV SOLN COMPARISON:  None. FINDINGS: Brain: Small area of reduced diffusion in the left frontal lobe involving the left precentral gyrus this lateral to hand motor region and extending into the centrum semiovale. No evidence of intracranial hemorrhage. There is no intracranial mass, mass effect, or edema. There is no hydrocephalus or extra-axial fluid collection. Ventricles and sulci are normal in size and configuration. No abnormal enhancement. Vascular: Major vessel flow  voids at the skull base are preserved. Skull and upper cervical spine: Normal marrow signal is preserved. Sinuses/Orbits: Minor mucosal thickening.  Orbits are unremarkable. Other: Sella is unremarkable.  Mastoid air cells are clear. 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 Electronically Signed   By: Guadlupe Spanish M.D.   On: 01/04/2021 15:11    Procedures .Critical Care Performed by: Margarita Grizzle, MD Authorized by: Margarita Grizzle, MD   Critical care provider statement:    Critical care time (minutes):  60   Critical care was time spent personally by me on the following activities:  Development of treatment plan with patient or surrogate, discussions with consultants, evaluation of patient's response to treatment, examination of patient, ordering and review of laboratory studies, ordering and review of radiographic studies, ordering and performing treatments and interventions, pulse oximetry, re-evaluation of patient's condition and review of old charts   Medications Ordered in ED Medications  clopidogrel (PLAVIX) tablet 75 mg (has no administration in time range)  iohexol (OMNIPAQUE) 350 MG/ML injection 70 mL (70 mLs Intravenous Contrast Given 01/04/21 1158)  gadobutrol (GADAVIST) 1 MMOL/ML injection 7 mL (7 mLs Intravenous Contrast Given 01/04/21 1441)    ED Course  I have reviewed the triage vital signs and the nursing notes.  Pertinent labs & imaging results that were available during my care of the patient were reviewed by me and considered in my medical decision making (see chart for details).    MDM Rules/Calculators/A&P                         Medical Decision Making 35 year old male presents today complaining of episodes of normal movement of his upper extremities.  Today it was right upper extremity and there is associated difficulty speaking.  He has had 2 prior episodes over his left upper extremity. Differential diagnosis includes stroke, aneurysmal  bleeding, bleeding, other masses, other diseases such as MS. Patient evaluated with CT and CTA no evidence of acute stroke or bleeding Discussed with neurology. Will obtain MRI with and without contrast  Amount and/or Complexity of Data Reviewed Labs: ordered. Decision-making details documented in ED Course. Radiology: ordered and independent interpretation performed. Decision-making details documented in ED Course. Discussion of management or test interpretation with external provider(s): Discussed results with Dr. Thomasena Edis, on for neuro hospitalist Plan MRI and Dr. Thomasena Edis will see in consultation    MRI reviewed and small infarcts noted per radiology Discussed again with Dr. Thomasena Edis.  Plan aspirin, he will admit to stroke service.  Final Clinical Impression(s) / ED Diagnoses Final diagnoses:  Cerebrovascular accident (CVA), unspecified mechanism (HCC)    Rx / DC Orders ED Discharge Orders     None        Margarita Grizzle, MD 01/04/21 1615

## 2021-01-05 ENCOUNTER — Inpatient Hospital Stay (HOSPITAL_COMMUNITY): Payer: BC Managed Care – PPO

## 2021-01-05 DIAGNOSIS — E785 Hyperlipidemia, unspecified: Secondary | ICD-10-CM | POA: Diagnosis present

## 2021-01-05 DIAGNOSIS — I639 Cerebral infarction, unspecified: Secondary | ICD-10-CM

## 2021-01-05 DIAGNOSIS — I6389 Other cerebral infarction: Secondary | ICD-10-CM

## 2021-01-05 LAB — ECHOCARDIOGRAM COMPLETE BUBBLE STUDY
AR max vel: 3.5 cm2
AV Area VTI: 3.63 cm2
AV Area mean vel: 3.21 cm2
AV Mean grad: 3 mmHg
AV Peak grad: 5.8 mmHg
Ao pk vel: 1.2 m/s
Area-P 1/2: 3.42 cm2
S' Lateral: 3.1 cm

## 2021-01-05 LAB — LIPID PANEL
Cholesterol: 209 mg/dL — ABNORMAL HIGH (ref 0–200)
HDL: 49 mg/dL (ref >40)
LDL Cholesterol: 146 mg/dL — ABNORMAL HIGH (ref 0–99)
Total CHOL/HDL Ratio: 4.3 RATIO
Triglycerides: 68 mg/dL (ref <150)
VLDL: 14 mg/dL (ref 0–40)

## 2021-01-05 LAB — HEMOGLOBIN A1C
Hgb A1c MFr Bld: 5.3 % (ref 4.8–5.6)
Mean Plasma Glucose: 105.41 mg/dL

## 2021-01-05 LAB — ANTITHROMBIN III: AntiThromb III Func: 110 % (ref 75–120)

## 2021-01-05 LAB — CBC
HCT: 42.7 % (ref 39.0–52.0)
Hemoglobin: 14.1 g/dL (ref 13.0–17.0)
MCH: 26.6 pg (ref 26.0–34.0)
MCHC: 33 g/dL (ref 30.0–36.0)
MCV: 80.6 fL (ref 80.0–100.0)
Platelets: 254 10*3/uL (ref 150–400)
RBC: 5.3 MIL/uL (ref 4.22–5.81)
RDW: 12.3 % (ref 11.5–15.5)
WBC: 7.3 10*3/uL (ref 4.0–10.5)
nRBC: 0 % (ref 0.0–0.2)

## 2021-01-05 LAB — SEDIMENTATION RATE: Sed Rate: 4 mm/hr (ref 0–16)

## 2021-01-05 LAB — HIV ANTIBODY (ROUTINE TESTING W REFLEX): HIV Screen 4th Generation wRfx: NONREACTIVE

## 2021-01-05 LAB — C-REACTIVE PROTEIN: CRP: 1.3 mg/dL — ABNORMAL HIGH (ref <1.0)

## 2021-01-05 LAB — CREATININE, SERUM
Creatinine, Ser: 1.05 mg/dL (ref 0.61–1.24)
GFR, Estimated: >60 mL/min (ref >60)

## 2021-01-05 MED ORDER — ENOXAPARIN SODIUM 40 MG/0.4ML IJ SOSY
40.0000 mg | PREFILLED_SYRINGE | INTRAMUSCULAR | Status: DC
  Administered 2021-01-05 – 2021-01-06 (×2): 40 mg via SUBCUTANEOUS
  Filled 2021-01-05 (×3): qty 0.4

## 2021-01-05 MED ORDER — ACETAMINOPHEN 650 MG RE SUPP: 650.0000 mg | RECTAL | Status: DC | PRN

## 2021-01-05 MED ORDER — ATORVASTATIN CALCIUM 40 MG PO TABS
40.0000 mg | ORAL_TABLET | Freq: Every day | ORAL | Status: DC
  Administered 2021-01-05 – 2021-01-06 (×2): 40 mg via ORAL
  Filled 2021-01-05 (×2): qty 1

## 2021-01-05 MED ORDER — ACETAMINOPHEN 325 MG PO TABS: 650.0000 mg | ORAL_TABLET | ORAL | Status: DC | PRN

## 2021-01-05 MED ORDER — STROKE: EARLY STAGES OF RECOVERY BOOK
Freq: Once | Status: AC
  Filled 2021-01-05: qty 1

## 2021-01-05 MED ORDER — ACETAMINOPHEN 160 MG/5ML PO SOLN: 650.0000 mg | ORAL | Status: DC | PRN

## 2021-01-05 MED ORDER — ASPIRIN 81 MG PO CHEW
81.0000 mg | CHEWABLE_TABLET | Freq: Every day | ORAL | Status: DC
  Administered 2021-01-05 – 2021-01-06 (×2): 81 mg via ORAL
  Filled 2021-01-05 (×2): qty 1

## 2021-01-05 MED ORDER — SENNOSIDES-DOCUSATE SODIUM 8.6-50 MG PO TABS: 1.0000 | ORAL_TABLET | Freq: Every evening | ORAL | Status: DC | PRN

## 2021-01-05 NOTE — Hospital Course (Addendum)
35 year old healthy male with past medical history of mild asthma presented to the emergency room on 12/31 after waking up that morning with right arm weakness and slurred speech, episodes lasting approximately 1 minute.  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  Started on aspirin and Plavix and admitted to hospitalist service for further work-up.  Overnight, started complaining of slurred speech and numb left foot with symptoms resolving.  Found to have LDL of 140 and started on Lipitor.  Echo with bubble study completed with results pending.  Seen by neurology and EEG ordered.  Seen by seen by cardiology with plans for TEE as outpatient on Wednesday, 1/4.

## 2021-01-05 NOTE — Progress Notes (Addendum)
STROKE TEAM PROGRESS NOTE   INTERVAL HISTORY No family at the bedside.  Discussed MRI results as well as the possibility of a TEE and loop recorder placement prior to discharge.  Patient had an episode overnight of feeling "off ", ordered an EEG.  Hypercoagulability panel ordered as well as CRP and sed rate.  Awaiting venous duplex results and echocardiogram results.   Vitals:   01/04/21 2314 01/05/21 0051 01/05/21 0300 01/05/21 0738  BP: 110/70 (!) 127/98 107/73 112/73  Pulse: 71 64 (!) 59 62  Resp: 18 16 18 14   Temp: 98 F (36.7 C) 98.1 F (36.7 C) 97.6 F (36.4 C) 97.6 F (36.4 C)  TempSrc: Oral Oral Oral Oral  SpO2: 97% 100% 97% 98%  Weight:      Height:       CBC:  Recent Labs  Lab 01/04/21 1113 01/04/21 1124 01/05/21 0204  WBC 6.7  --  7.3  NEUTROABS 4.6  --   --   HGB 14.4 15.6 14.1  HCT 44.7 46.0 42.7  MCV 81.9  --  80.6  PLT 301  --  0000000   Basic Metabolic Panel:  Recent Labs  Lab 01/04/21 1113 01/04/21 1124 01/05/21 0204  NA 139 140  --   K 3.7 3.6  --   CL 104 102  --   CO2 27  --   --   GLUCOSE 94 89  --   BUN 15 21*  --   CREATININE 1.02 0.90 1.05  CALCIUM 9.2  --   --    Lipid Panel:  Recent Labs  Lab 01/05/21 0204  CHOL 209*  TRIG 68  HDL 49  CHOLHDL 4.3  VLDL 14  LDLCALC 146*   HgbA1c:  Recent Labs  Lab 01/05/21 0204  HGBA1C 5.3   Urine Drug Screen:  Recent Labs  Lab 01/04/21 1106  LABOPIA NONE DETECTED  COCAINSCRNUR NONE DETECTED  LABBENZ NONE DETECTED  AMPHETMU NONE DETECTED  THCU NONE DETECTED  LABBARB NONE DETECTED    Alcohol Level  Recent Labs  Lab 01/04/21 1113  ETH <10    IMAGING past 24 hours CT ANGIO HEAD NECK W WO CM  Result Date: 01/04/2021 CLINICAL DATA:  Spasms, slurred speech EXAM: CT ANGIOGRAPHY HEAD AND NECK TECHNIQUE: Multidetector CT imaging of the head and neck was performed using the standard protocol during bolus administration of intravenous contrast. Multiplanar CT image reconstructions and  MIPs were obtained to evaluate the vascular anatomy. Carotid stenosis measurements (when applicable) are obtained utilizing NASCET criteria, using the distal internal carotid diameter as the denominator. CONTRAST:  63mL OMNIPAQUE IOHEXOL 350 MG/ML SOLN COMPARISON:  None. FINDINGS: CT HEAD FINDINGS Brain: There is no acute intracranial hemorrhage, extra-axial fluid collection, or acute infarct. Parenchymal volume is normal. The ventricles are normal in size. Gray-white differentiation is preserved. There is no mass lesion.  There is no midline shift. Vascular: No hyperdense vessel or unexpected calcification. Skull: Normal. Negative for fracture or focal lesion. Sinuses: There is minimal mucosal thickening in the maxillary sinuses. Orbits: The globes and orbits are unremarkable. Review of the MIP images confirms the above findings CTA NECK FINDINGS Aortic arch: The aortic arch is normal. The origins of the major branch vessels are patent. Right carotid system: The right common, internal, and external carotid arteries are patent, without hemodynamically significant stenosis or occlusion. There is no dissection or aneurysm. Left carotid system: The left common, internal, and external carotid arteries are patent, without hemodynamically significant stenosis or  occlusion. There is no dissection or aneurysm. Vertebral arteries: The vertebral arteries are codominant. The vertebral arteries are patent, without hemodynamically significant stenosis or occlusion. There is no dissection or aneurysm. Skeleton: There is no acute osseous abnormality or aggressive osseous lesion. Postsurgical changes are noted in the mandible bilaterally. Other neck: The soft tissues are unremarkable. Upper chest: The imaged lung apices are clear. Review of the MIP images confirms the above findings CTA HEAD FINDINGS Anterior circulation: The intracranial ICAs are patent. The bilateral MCAs are patent. The bilateral ACAs are patent. The anterior  communicating artery is patent. There is no aneurysm. Posterior circulation: The bilateral V4 segments are patent. PICA is patent bilaterally. The basilar artery is patent. The bilateral PCAs are patent. The posterior communicating arteries are not identified. There is no aneurysm. Venous sinuses: Patent. Anatomic variants: None. Review of the MIP images confirms the above findings IMPRESSION: 1. No acute intracranial pathology. 2. Normal CTA of the head and neck. Electronically Signed   By: Valetta Mole M.D.   On: 01/04/2021 12:15   MR Brain W and Wo Contrast  Result Date: 01/04/2021 CLINICAL DATA:  Neuro deficit, acute, stroke suspected EXAM: MRI HEAD WITHOUT AND WITH CONTRAST TECHNIQUE: Multiplanar, multiecho pulse sequences of the brain and surrounding structures were obtained without and with intravenous contrast. CONTRAST:  66mL GADAVIST GADOBUTROL 1 MMOL/ML IV SOLN COMPARISON:  None. FINDINGS: Brain: Small area of reduced diffusion in the left frontal lobe involving the left precentral gyrus this lateral to hand motor region and extending into the centrum semiovale. No evidence of intracranial hemorrhage. There is no intracranial mass, mass effect, or edema. There is no hydrocephalus or extra-axial fluid collection. Ventricles and sulci are normal in size and configuration. No abnormal enhancement. Vascular: Major vessel flow voids at the skull base are preserved. Skull and upper cervical spine: Normal marrow signal is preserved. Sinuses/Orbits: Minor mucosal thickening.  Orbits are unremarkable. Other: Sella is unremarkable.  Mastoid air cells are clear. 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 Electronically Signed   By: Macy Mis M.D.   On: 01/04/2021 15:11    PHYSICAL EXAM  Temp:  [97.6 F (36.4 C)-98.4 F (36.9 C)] 97.6 F (36.4 C) (12/31 0738) Pulse Rate:  [59-92] 62 (12/31 0738) Resp:  [11-20] 14 (12/31 0738) BP: (104-134)/(70-98) 112/73 (12/31 0738) SpO2:  [97  %-100 %] 98 % (12/31 0738) Weight:  [79.4 kg] 79.4 kg (12/30 1116)  General - Well nourished, well developed, in no apparent distress. Ophthalmologic - fundi not visualized due to noncooperation. Cardiovascular - Regular rhythm and rate.  Mental Status -  Level of arousal and orientation to time, place, and person were intact. Language including expression, naming, repetition, comprehension was assessed and found intact. Attention span and concentration were normal. Recent and remote memory were intact. Fund of Knowledge was assessed and was intact.  Cranial Nerves II - XII - II - Visual field intact OU. III, IV, VI - Extraocular movements intact. V - Facial sensation intact bilaterally. VII - Facial movement intact bilaterally. VIII - Hearing & vestibular intact bilaterally. X - Palate elevates symmetrically. XI - Chin turning & shoulder shrug intact bilaterally. XII - Tongue protrusion intact.  Motor Strength - The patients strength was normal in all extremities and pronator drift was absent.5/5 strength in both UE and LE proximally and distal.  Bulk was normal and fasciculations were absent Motor Tone - Muscle tone was assessed at the neck and appendages and was normal.  Reflexes - The patients reflexes were symmetrical in all extremities and he had no pathological reflexes. Sensory - Temperature sensation diminished on the right, pinprick slightly diminished on the right Coordination - The patient had normal movements in the hands and feet with no ataxia or dysmetria.  Tremor was absent. Gait and Station - deferred.   ASSESSMENT/PLAN Jimmy Kim is a 35 y.o. male with history of well-controlled asthma presenting with had uncontrollable, involuntary writhing of his right arm and hand followed by slurred speech which began around 0930 this morning which lasted ~1 minute. He had a somewhat similar episode in character and duration without slurred speech ~about a month ago.  Prior to that he had an episode of blurred vision which lasted about several minutes which completely resolved. He states that he is never smoked, used any sort of drugs, or ever had alcohol.  He is not aware of any family history of heart disease or neurological disease.  He states that his diet has not been as good as it used to be and he has not been working out the way he used to be over the last 2 to 3 months.  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  Plan to do TEE, echocardiogram,Venous duplex, EEG, and send out the hypercoagulability panel given unclear source of stroke.  Total cholesterol 209, HDL 49, LDL 146, triglycerides 68. Hemoglobin A1c 5.3 WBC 7.3  Stroke:  left frontal lobe infarct likely secondary to possible embolic vs cryptogenic source CTA head & neck no acute intracranial pathology MRI small acute infarct of the left frontal lobe 2D Echo with bubble study-pending TEE pending BLE Venous Duplex-no evidence of DVT Hypercoagulability panel- pending   EEG- pending Sed Rate- 4 CRP- 1.3 LDL 146 HgbA1c 5.3 VTE prophylaxis - lovenox 40mg  daily No antithrombotic prior to admission, now on aspirin 81 mg daily and clopidogrel 75 mg daily. Continue for 3 weeks Therapy recommendations: No follow-up recommended disposition:  pending  Hypertension Home meds:  none Stable Permissive hypertension (OK if < 220/120) but gradually normalize in 5-7 days Long-term BP goal normotensive  Hyperlipidemia Home meds:  None LDL 146, goal < 70 Add Atorvastatin 40mg   Continue statin at discharge   Hospital day # 1  Patient seen and examined by NP/APP with MD. MD to update note as needed.   , DNP, FNP-BC [facility] Pager: [phone]  ATTENDING ATTESTATION:  Cryptogenic left frontal CVA. Young stroke workup ordered. NIHSS: 0. EEG to r/o  interictal activity given event overnight. Will need loop monitor. DAPT and close f/u in stroke clinic. He is a Jimmy Kim and has pts to see next week.  Will schedule outpt TEE.  Dr. [phone] evaluated pt independently, reviewed imaging, chart, labs. Discussed and formulated plan with the APP. Please see APP note above for details.   Total 30 minutes spent on counseling patient and coordinating care, writing notes and reviewing chart. Pt's questions were answered to his satisfaction.  Dock Baccam,MD   To contact Stroke Continuity provider, please refer to Education officer, community. After hours, contact General Neurology

## 2021-01-05 NOTE — Progress Notes (Signed)
Was called to room because complained that feels funny again. New stroke dx yesterday. Said sx returned of tongue feeling heavy and feeling like he is slurring his speech. C/O numb left foot. NIHSS was 1 for his decreased sensation but he did not have any slurred speech.  I paged Dr Otelia Limes.  Stroke protocol had already been intiated because of new stroke diagnosis.  VSS.  Resting at this time.

## 2021-01-05 NOTE — Evaluation (Signed)
Physical Therapy Evaluation and Discharge Patient Details Name: Jimmy Kim MRN: [id number] DOB: 1985-10-08 Today's Date: 01/05/2021  History of Present Illness  Pt is a 35 y.o. male who presents with right arm weakness and slurred speech. These syptoms lasted for about a minute and then resolved. Pt became concerned and came to ED. 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 PMH significant for asthma well-controlled.   Clinical Impression  Patient evaluated by Physical Therapy with no further acute PT needs identified. All education has been completed and the patient has no further questions. At the time of PT eval pt was able to perform transfers and ambulation with independence. Pt reports feeling back to baseline of function except for feeling that his R hand is still "off". Testing did not reveal any significant deficits in the R hand; coordination, strength, and sensation testing all WNL and equal to L. Pt scored 24/24 on the DGI. See below for any follow-up Physical Therapy or equipment needs. PT is signing off. Thank you for this referral.        Recommendations for follow up therapy are one component of a multi-disciplinary discharge planning process, led by the attending physician.  Recommendations may be updated based on patient status, additional functional criteria and insurance authorization.  Follow Up Recommendations No PT follow up    Assistance Recommended at Discharge None  Functional Status Assessment Patient has had a recent decline in their functional status and demonstrates the ability to make significant improvements in function in a reasonable and predictable amount of time.  Equipment Recommendations  None recommended by PT    Recommendations for Other Services       Precautions / Restrictions Precautions Precautions: None Restrictions Weight Bearing Restrictions: No      Mobility  Bed Mobility Overal bed mobility: Independent                   Transfers Overall transfer level: Independent Equipment used: None                    Ambulation/Gait Ambulation/Gait assistance: Independent Gait Distance (Feet): 500 Feet Assistive device: None Gait Pattern/deviations: WFL(Within Functional Limits) Gait velocity: Normal walking speed Gait velocity interpretation: >4.37 ft/sec, indicative of normal walking speed   General Gait Details: Long strides without evidence of unsteadiness or LOB.  Stairs Stairs: Yes Stairs assistance: Independent Stair Management: No rails;Alternating pattern;Forwards Number of Stairs: 20 General stair comments: 2 flights of stairs without rails and no difficulty/unsteadiness.  Wheelchair Mobility    Modified Rankin (Stroke Patients Only) Modified Rankin (Stroke Patients Only) Pre-Morbid Rankin Score: No symptoms Modified Rankin: No significant disability     Balance Overall balance assessment: Independent                               Standardized Balance Assessment Standardized Balance Assessment : Dynamic Gait Index   Dynamic Gait Index Level Surface: Normal Change in Gait Speed: Normal Gait with Horizontal Head Turns: Normal Gait with Vertical Head Turns: Normal Gait and Pivot Turn: Normal Step Over Obstacle: Normal Step Around Obstacles: Normal Steps: Normal Total Score: 24       Pertinent Vitals/Pain Pain Assessment: No/denies pain    Home Living Family/patient expects to be discharged to:: Private residence Living Arrangements: Spouse/significant other;Children Available Help at Discharge: Family;Available 24 hours/day Type of Home: House Home Access: Stairs to enter   [employer] of  Steps: 1 Alternate Level Stairs-Number of Steps: 20 Home Layout: Two level Home Equipment: Shower seat - built in      Prior Function Prior Level of Function : Independent/Modified Independent;Driving;Working/employed               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     Higher education careers adviser   Dominant Hand: Right    Extremity/Trunk Assessment   Upper Extremity Assessment Upper Extremity Assessment: Overall WFL for tasks assessed RUE Deficits / Details: All ROM and MMT symmetrical with LT UE. Pt endorses some mild sensation changes which light touch testing with glove was unable to detect. Pt able to use RT hand to draw a clock to 1400 without error and write sentences with pt report of mild amount more difficulty than baseline.  Pt must use different fine tools for job with both think and very thin handles. 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, community PT Goal Formulation: All assessment and education complete, DC therapy    Frequency     Barriers to discharge        Co-evaluation               AM-PAC PT "6 Clicks" Mobility  Outcome Measure Help needed turning from your back to your side while in a flat bed without using bedrails?: None Help needed moving from lying on your back to sitting on the side of a flat bed without using bedrails?:  None Help needed moving to and from a bed to a chair (including a wheelchair)?: None Help needed standing up from a chair using your arms (e.g., wheelchair or bedside chair)?: None Help needed to walk in hospital room?: None Help needed climbing 3-5 steps with a railing? : None 6 Click Score: 24    End of Session   Activity Tolerance: Patient tolerated treatment well Patient left: in bed;with call bell/phone within reach;Other (comment) (MD, Lab present) Nurse Communication: Mobility status PT Visit Diagnosis: Other symptoms and signs involving the nervous system (R29.898)    Time: 1053-1110 PT Time Calculation (min) (ACUTE ONLY): 17 min   Charges:   PT Evaluation $PT Eval Low Complexity: 1 Low          Conni Slipper, PT, DPT Acute Rehabilitation Services Pager: [phone] Office: [phone]   Marylynn Pearson 01/05/2021, 12:33 PM

## 2021-01-05 NOTE — Progress Notes (Signed)
Called about scattered neurological symptoms without a clear localization. NIHSS 1. No indication for STAT imaging. Continue to monitor.   Electronically signed: Dr. Caryl Pina

## 2021-01-05 NOTE — Progress Notes (Signed)
EEG complete - results pending 

## 2021-01-05 NOTE — Progress Notes (Signed)
Lower extremity venous has been completed.   Preliminary results in CV Proc.   Jimmy Kim Jimmy Kim 01/05/2021 11:08 AM

## 2021-01-05 NOTE — Plan of Care (Signed)
  Problem: Education: Goal: Knowledge of General Education information will improve Description Including pain rating scale, medication(s)/side effects and non-pharmacologic comfort measures Outcome: Progressing   

## 2021-01-05 NOTE — Progress Notes (Signed)
Dr Otelia Limes returned my call and said that he is already here for stroke and with those scattered sx like that, it is not too alarming but will continue to monitor.  I did inform patient and he is resting comfortably at this time.

## 2021-01-05 NOTE — Assessment & Plan Note (Signed)
Fasting lipid panel notes LDL of 140 with goal of less than 70.  Lipitor 40 started.

## 2021-01-05 NOTE — Evaluation (Signed)
Occupational Therapy Evaluation Patient Details Name: Jimmy Kim MRN: [id number] DOB: July 26, 1985 Today's Date: 01/05/2021   History of Present Illness Pt is a 35 y.o. male who presents with right arm weakness and slurred speech. These syptoms lasted for about a minute and then resolved. Pt became concerned and came to ED. 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 PMH significant for asthma well-controlled.   Clinical Impression   Very pleasant patient evaluated by Occupational Therapy with no further acute OT needs identified. All education has been completed and the patient has no further questions. Pt verbalized understanding re: how to seek out outpatient hand OT is needed prior to return to work. Pt verbalized understanding to HEP.  See below for any follow-up Occupational Therapy or equipment needs. Acute OT is signing off. Thank you for this referral.       Recommendations for follow up therapy are one component of a multi-disciplinary discharge planning process, led by the attending physician.  Recommendations may be updated based on patient status, additional functional criteria and insurance authorization.   Follow Up Recommendations  No OT follow up (Pt educated on how to contact PCP if pt finds that his RT hand is not recovering as anticipated in order to begin outpatient OT for hand.)    Assistance Recommended at Discharge None  Functional Status Assessment  Patient has had a recent decline in their functional status and demonstrates the ability to make significant improvements in function in a reasonable and predictable amount of time.  Equipment Recommendations  None recommended by OT    Recommendations for Other Services       Precautions / Restrictions Precautions Precautions: None Restrictions Weight Bearing Restrictions: No      Mobility Bed Mobility Overal bed mobility: Independent                  Transfers                           Balance Overall balance assessment: Independent                                         ADL either performed or assessed with clinical judgement   ADL Overall ADL's : At baseline;Independent                                             Vision Baseline Vision/History: 0 No visual deficits Patient Visual Report: No change from baseline Vision Assessment?: No apparent visual deficits     Perception Perception Perception: Within Functional Limits   Praxis Praxis Praxis: Intact    Pertinent Vitals/Pain Pain Assessment: No/denies pain     Hand Dominance Right   Extremity/Trunk Assessment Upper Extremity Assessment Upper Extremity Assessment: Overall WFL for tasks assessed;RUE deficits/detail RUE Deficits / Details: All ROM and MMT symmetrical with LT UE. Pt endorses some mild sensation changes which light touch testing with glove was unable to detect. Pt able to use RT hand to draw a clock to 1400 without error and write sentences with pt report of mild amount more difficulty than baseline.  Pt must use different fine tools for job with both think and very thin handles. 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     General Comments       Exercises     Shoulder Instructions      Home Living Family/patient expects to be discharged to:: Private residence Living Arrangements: Spouse/significant  other;Children Available Help at Discharge: Family;Available 24 hours/day Type of Home: House Home Access: Stairs to enter [employer] of Steps: 1   Home Layout: Two level Alternate Level Stairs-Number of Steps: 20 Alternate Level Stairs-Rails: Right;Left Bathroom Shower/Tub: Walk-in shower;Door   [employer] Toilet: Standard     Home Equipment: Shower seat - built in          Prior Functioning/Environment Prior Level of Function : Independent/Modified Independent;Driving;Working/employed               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 OT Goal Formulation: All assessment and education complete, DC therapy Potential to Achieve Goals: Good  OT Frequency:     Barriers to D/C:            Co-evaluation              AM-PAC OT "6 Clicks" Daily Activity     Outcome Measure Help from another person eating meals?: None Help from another person taking care of personal grooming?: None Help from another person toileting, which includes using toliet, bedpan, or urinal?: None Help from another person bathing (including washing, rinsing, drying)?: None Help from another person to put on and taking off regular upper body clothing?: None Help from another person to put on and taking off regular lower body clothing?: None 6 Click Score: 24   End of Session    Activity Tolerance: Patient tolerated treatment well Patient left:  (Sitting EOB, practicing writing)  OT Visit Diagnosis:  (RT hand sensation and coordination changes.)                Time: 8315-1761 OT Time Calculation (min): 21 min Charges:  OT General Charges $OT Visit: 1 Visit OT Evaluation $OT Eval  Low Complexity: 1 Low  Joeann Steppe, OT Acute Rehab Services Office: [phone] 01/05/2021  Theodoro Clock 01/05/2021, 12:15 PM

## 2021-01-05 NOTE — Progress Notes (Signed)
° °  [facility] has been requested to perform a transesophageal echocardiogram on Jimmy Kim for stroke.  After careful review of history and examination, the risks and benefits of transesophageal echocardiogram have been explained including risks of esophageal damage, perforation (1:10,000 risk), bleeding, pharyngeal hematoma as well as other potential complications associated with conscious sedation including aspiration, arrhythmia, respiratory failure and death. Alternatives to treatment were discussed, questions were answered. Patient is willing to proceed.   We do not have any availability on the TEE schedule until Wednesday 01/10/2020. Therefore, it sounds like the plan is for patient to be discharged. Neurology has said outpatient TEE is okay. Patient has never formally been seen by our office so I think he will require a New Patient visit in order for [place] to arrange this as an outpatient. I will clarify with our Cardmaster when she returns on 01/09/2020. Regardless, our office will call patient to arrange New Patient Visit or TEE.  Corrin Parker, PA-C 01/05/2021 2:46 PM

## 2021-01-05 NOTE — Progress Notes (Signed)
°  Echocardiogram 2D Echocardiogram with bubble has been performed.  Jimmy Kim F 01/05/2021, 4:28 PM

## 2021-01-05 NOTE — Progress Notes (Signed)
[facility] Progress Note  Patient: Jimmy Kim    WIO:[id number]  DOA: 01/04/2021    Date of Service: the patient was seen and examined on 01/05/2021  Brief hospital course: 35 year old healthy male with past medical history of mild asthma presented to the emergency room on 12/31 after waking up that morning with right arm weakness and slurred speech, episodes lasting approximately 1 minute.  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  Started on aspirin and Plavix and admitted to hospitalist service for further work-up.  Overnight, started complaining of slurred speech and numb left foot with symptoms resolving.  Found to have LDL of 140 and started on Lipitor.  Echo with bubble study completed with results pending.  Seen by neurology and EEG ordered.  Seen by seen by cardiology with plans for TEE as outpatient on Wednesday, 1/4.  Assessment and Plan: Cardiovascular and Mediastinum * Acute CVA (cerebrovascular accident) [facility]) Assessment & Plan Curious in a patient with no risk factors.  He did however travel to [facility] biplane in October and then symptoms started approximately a month later.  This can be concerning that he may have a DVT in his lower extremities and this is a patent PFO causing CVA.  He did not appear to have any other risk factors.  No family or genetic history.  Appreciate neurology help.  CT angiogram of neck notes no significant plaque in carotids bilaterally.  2D echo with bubble study completed-results pending.  Seen by neurology who have ordered EEG and TEE.  TEE cannot be done until Wednesday, 1/4 so patient will have this done as outpatient.  EEG should be done later tonight.  If work-up is negative, he will likely need a 30-day event monitor as well.  Respiratory Asthma Assessment & Plan Stable medical issue.  Uses an inhaler perhaps once or twice a year.  Other Hyperlipidemia LDL goal <70 Assessment & Plan Fasting lipid panel notes  LDL of 140 with goal of less than 70.  Lipitor 40 started.  Dysarthria Assessment & Plan Secondary to CVA.  Resolved.    Body mass index is 23.73 kg/m.        Consultants: Neurology Cardiology  Procedures: 2D echo with bubble study: Results pending EEG: To be completed  Antimicrobials: None  Code Status: Full code   Subjective: Patient doing okay.  No complaints.  No arm weakness or numbness or speech issues.  Objective: Vital signs were reviewed and unremarkable. Vitals:   01/05/21 1125 01/05/21 1511  BP: 114/75 111/78  Pulse: 64 65  Resp: 16 18  Temp: 98.6 F (37 C) 98.7 F (37.1 C)  SpO2: 100% 97%    Intake/Output Summary (Last 24 hours) at 01/05/2021 1832 Last data filed at 01/05/2021 1200 Gross per 24 hour  Intake 680 ml  Output --  Net 680 ml   Filed Weights   01/04/21 1116  Weight: 79.4 kg   Body mass index is 23.73 kg/m.  Exam:  General: Alert and oriented x3, no acute distress HEENT: Normocephalic and atraumatic, mucous membranes are moist Cardiovascular: Regular rate and rhythm, S1-S2 Respiratory: Clear to auscultation bilaterally Abdomen: Soft, nontender, nondistended, positive bowel sounds Musculoskeletal: No clubbing or cyanosis or edema Skin: No skin breaks, tears or lesions Psychiatry: Appropriate, no evidence of psychoses Neurology: No focal deficits.  Cranial nerves are intact.  Data Reviewed: My review of labs, imaging, notes and other tests shows no new significant findings.  Fasting lipid  panel reviewed, noted elevated LDL at 140.  A1c at 5.3  Disposition:  Status is: Inpatient  Remains inpatient appropriate because: Need to complete inpatient study of EEG   Family Communication: Updated wife on 12/30 DVT Prophylaxis: enoxaparin (LOVENOX) injection 40 mg Start: 01/05/21 0109    Author: Hollice Espy ,MD 01/05/2021 6:32 PM  To reach On-call, see care teams to locate the attending and reach out via  [email or web address]. Between 7PM-7AM, please contact night-coverage If you still have difficulty reaching the attending provider, please page the [facility] (Director on Call) for [facility] on amion for assistance.

## 2021-01-06 DIAGNOSIS — Q2112 Patent foramen ovale: Secondary | ICD-10-CM

## 2021-01-06 DIAGNOSIS — R569 Unspecified convulsions: Secondary | ICD-10-CM

## 2021-01-06 LAB — CARDIOLIPIN ANTIBODIES, IGG, IGM, IGA
Anticardiolipin IgA: 11 APL U/mL (ref 0–11)
Anticardiolipin IgG: <9 GPL U/mL (ref 0–14)
Anticardiolipin IgM: 17 MPL U/mL — ABNORMAL HIGH (ref 0–12)

## 2021-01-06 MED ORDER — CLOPIDOGREL BISULFATE 75 MG PO TABS: 75.0000 mg | ORAL_TABLET | Freq: Every day | ORAL | 0 refills | Status: DC

## 2021-01-06 MED ORDER — ASPIRIN 81 MG PO CHEW: 81.0000 mg | CHEWABLE_TABLET | Freq: Every day | ORAL | 2 refills | Status: DC

## 2021-01-06 MED ORDER — ATORVASTATIN CALCIUM 40 MG PO TABS: 40.0000 mg | ORAL_TABLET | Freq: Every day | ORAL | 2 refills | Status: DC

## 2021-01-06 NOTE — Plan of Care (Signed)
°  Problem: Education: Goal: Knowledge of General Education information will improve Description: Including pain rating scale, medication(s)/side effects and non-pharmacologic comfort measures Outcome: Progressing   Problem: Health Behavior/Discharge Planning: Goal: Ability to manage health-related needs will improve Outcome: Progressing   Problem: Clinical Measurements: Goal: Ability to maintain clinical measurements within normal limits will improve Outcome: Progressing Goal: Will remain free from infection Outcome: Progressing Goal: Diagnostic test results will improve Outcome: Progressing Goal: Respiratory complications will improve Outcome: Progressing Goal: Cardiovascular complication will be avoided Outcome: Progressing   Problem: Activity: Goal: Risk for activity intolerance will decrease Outcome: Progressing   Problem: Nutrition: Goal: Adequate nutrition will be maintained Outcome: Progressing   Problem: Coping: Goal: Level of anxiety will decrease Outcome: Progressing   Problem: Elimination: Goal: Will not experience complications related to bowel motility Outcome: Progressing Goal: Will not experience complications related to urinary retention Outcome: Progressing   Problem: Pain Managment: Goal: General experience of comfort will improve Outcome: Progressing   Problem: Safety: Goal: Ability to remain free from injury will improve Outcome: Progressing   Problem: Skin Integrity: Goal: Risk for impaired skin integrity will decrease Outcome: Progressing   Problem: Education: Goal: Knowledge of disease or condition will improve Outcome: Progressing Goal: Knowledge of secondary prevention will improve (SELECT ALL) Outcome: Progressing Goal: Knowledge of patient specific risk factors will improve (INDIVIDUALIZE FOR PATIENT) Outcome: Progressing Goal: Individualized Educational Video(s) Outcome: Progressing   Problem: Coping: Goal: Will verbalize  positive feelings about self Outcome: Progressing Goal: Will identify appropriate support needs Outcome: Progressing   Problem: Health Behavior/Discharge Planning: Goal: Ability to manage health-related needs will improve Outcome: Progressing   Problem: Self-Care: Goal: Verbalization of feelings and concerns over difficulty with self-care will improve Outcome: Progressing

## 2021-01-06 NOTE — Procedures (Signed)
Patient Name: Jimmy Kim  MRN: [id number]  Epilepsy Attending: Lora Havens  Referring Physician/Provider: Janine Ores, NP Date: 01/05/2021 Duration: 28.27 mins  Patient history: 36 y.o. male with history of well-controlled asthma presenting with had uncontrollable, involuntary writhing of his right arm and hand followed by slurred speech which began around 0930 this morning which lasted ~1 minute. He had a somewhat similar episode in character and duration without slurred speech ~about a month ago. EEG to evaluate for seizure  Level of alertness: Awake, asleep  AEDs during EEG study: None  Technical aspects: This EEG study was done with scalp electrodes positioned according to the 10-20 International system of electrode placement. Electrical activity was acquired at a sampling rate of 500Hz  and reviewed with a high frequency filter of 70Hz  and a low frequency filter of 1Hz . EEG data were recorded continuously and digitally stored.   Description: The posterior dominant rhythm consists of 10 Hz activity of moderate voltage (25-35 uV) seen predominantly in posterior head regions, symmetric and reactive to eye opening and eye closing. Sleep was characterized by vertex waves, sleep spindles (12 to 14 Hz), maximal frontocentral region. Physiologic photic driving was seen during photic stimulation.  Hyperventilation was not performed.     IMPRESSION: This study is within normal limits. No seizures or epileptiform discharges were seen throughout the recording.  Jimmy Kim [name]

## 2021-01-06 NOTE — Progress Notes (Addendum)
STROKE TEAM PROGRESS NOTE   INTERVAL HISTORY No family at the bedside.  No further transient events. EEG neg. Echo shows PFO. Setup with cardiology for TEE on Wednesday and f/u to discuss PFO closure outpt.  Vitals:   01/05/21 2349 01/06/21 0427 01/06/21 0724 01/06/21 1133  BP: 100/61 99/64 106/67 108/82  Pulse: (!) 55 (!) 57 66 96  Resp: 17 16 16 16   Temp: 98.1 F (36.7 C) 97.8 F (36.6 C) 97.7 F (36.5 C) 98.4 F (36.9 C)  TempSrc: Oral Axillary Oral Oral  SpO2: 98% 99% 99% 99%  Weight:      Height:       CBC:  Recent Labs  Lab 01/04/21 1113 01/04/21 1124 01/05/21 0204  WBC 6.7  --  7.3  NEUTROABS 4.6  --   --   HGB 14.4 15.6 14.1  HCT 44.7 46.0 42.7  MCV 81.9  --  80.6  PLT 301  --  0000000   Basic Metabolic Panel:  Recent Labs  Lab 01/04/21 1113 01/04/21 1124 01/05/21 0204  NA 139 140  --   K 3.7 3.6  --   CL 104 102  --   CO2 27  --   --   GLUCOSE 94 89  --   BUN 15 21*  --   CREATININE 1.02 0.90 1.05  CALCIUM 9.2  --   --    Lipid Panel:  Recent Labs  Lab 01/05/21 0204  CHOL 209*  TRIG 68  HDL 49  CHOLHDL 4.3  VLDL 14  LDLCALC 146*   HgbA1c:  Recent Labs  Lab 01/05/21 0204  HGBA1C 5.3   Urine Drug Screen:  Recent Labs  Lab 01/04/21 1106  LABOPIA NONE DETECTED  COCAINSCRNUR NONE DETECTED  LABBENZ NONE DETECTED  AMPHETMU NONE DETECTED  THCU NONE DETECTED  LABBARB NONE DETECTED    Alcohol Level  Recent Labs  Lab 01/04/21 1113  ETH <10    IMAGING past 24 hours EEG adult  Result Date: 01/06/2021 Lora Havens, MD     01/06/2021  7:13 AM Patient Name: Jimmy Kim MRN: [id number] Epilepsy Attending: Lora Havens Referring Physician/Provider: Janine Ores, NP Date: 01/05/2021 Duration: 28.27 mins Patient history: 36 y.o. male with history of well-controlled asthma presenting with had uncontrollable, involuntary writhing of his right arm and hand followed by slurred speech which began around 0930 this morning which lasted ~1  minute. He had a somewhat similar episode in character and duration without slurred speech ~about a month ago. EEG to evaluate for seizure Level of alertness: Awake, asleep AEDs during EEG study: None Technical aspects: This EEG study was done with scalp electrodes positioned according to the 10-20 International system of electrode placement. Electrical activity was acquired at a sampling rate of 500Hz  and reviewed with a high frequency filter of 70Hz  and a low frequency filter of 1Hz . EEG data were recorded continuously and digitally stored. Description: The posterior dominant rhythm consists of 10 Hz activity of moderate voltage (25-35 uV) seen predominantly in posterior head regions, symmetric and reactive to eye opening and eye closing. Sleep was characterized by vertex waves, sleep spindles (12 to 14 Hz), maximal frontocentral region. Physiologic photic driving was seen during photic stimulation.  Hyperventilation was not performed.   IMPRESSION: This study is within normal limits. No seizures or epileptiform discharges were seen throughout the recording. Lora Havens   ECHOCARDIOGRAM COMPLETE BUBBLE STUDY  Result Date: 01/05/2021    ECHOCARDIOGRAM REPORT   Patient  Name:   ABDALRAHMAN Kim Date of Exam: 01/05/2021 Medical Rec #:  [id number]       Height:       72.0 in Accession #:    [id number]      Weight:       175.0 lb Date of Birth:  28-Feb-1985        BSA:          2.013 m Patient Age:    69 years        BP:           111/78 mmHg Patient Gender: M               HR:           72 bpm. Exam Location:  Inpatient Procedure: 2D Echo, Cardiac Doppler, Color Doppler and Saline Contrast Bubble            Study Indications:   Stroke  History:       Patient has no prior history of Echocardiogram examinations.                Asthma.  Sonographer:   Merrie Roof RDCS Referring      [place]  1. Left ventricular ejection fraction, by estimation, is 60 to 65%. The left ventricle has  normal function. The left ventricle has no regional wall motion abnormalities. Left ventricular diastolic parameters were normal.  2. Right ventricular systolic function is normal. The right ventricular size is normal.  3. The mitral valve is grossly normal. No evidence of mitral valve regurgitation.  4. The aortic valve is tricuspid. Aortic valve regurgitation is not visualized.  5. The inferior vena cava is normal in size with greater than 50% respiratory variability, suggesting right atrial pressure of 3 mmHg.  6. Evidence of atrial level shunting detected by color flow Doppler. Agitated saline contrast bubble study was positive with shunting observed within 3-6 cardiac cycles suggestive of interatrial shunt. There is a moderately sized patent foramen ovale with bidirectional shunting across atrial septum. Comparison(s): No prior Echocardiogram. Conclusion(s)/Recommendation(s): Findings concerning for PFO, would recommend Transesophageal Echocardiogram for clarification and evaluation for possible device closure. FINDINGS  Left Ventricle: Left ventricular ejection fraction, by estimation, is 60 to 65%. The left ventricle has normal function. The left ventricle has no regional wall motion abnormalities. The left ventricular internal cavity size was normal in size. There is  no left ventricular hypertrophy. Left ventricular diastolic parameters were normal. Right Ventricle: The right ventricular size is normal. No increase in right ventricular wall thickness. Right ventricular systolic function is normal. Left Atrium: Left atrial size was normal in size. Right Atrium: Right atrial size was normal in size. Pericardium: There is no evidence of pericardial effusion. Mitral Valve: The mitral valve is grossly normal. No evidence of mitral valve regurgitation. Tricuspid Valve: The tricuspid valve is grossly normal. Tricuspid valve regurgitation is not demonstrated. Aortic Valve: The aortic valve is tricuspid. Aortic  valve regurgitation is not visualized. Aortic valve mean gradient measures 3.0 mmHg. Aortic valve peak gradient measures 5.8 mmHg. Aortic valve area, by VTI measures 3.63 cm. Pulmonic Valve: The pulmonic valve was normal in structure. Pulmonic valve regurgitation is trivial. Aorta: The aortic root and ascending aorta are structurally normal, with no evidence of dilitation. Venous: The inferior vena cava is normal in size with greater than 50% respiratory variability, suggesting right atrial pressure of 3 mmHg. IAS/Shunts: The interatrial septum is aneurysmal. Evidence of atrial level  shunting detected by color flow Doppler. Agitated saline contrast was given intravenously to evaluate for intracardiac shunting. Agitated saline contrast bubble study was positive  with shunting observed within 3-6 cardiac cycles suggestive of interatrial shunt. A moderately sized patent foramen ovale is detected with bidirectional shunting across atrial septum.  LEFT VENTRICLE PLAX 2D LVIDd:         4.70 cm   Diastology LVIDs:         3.10 cm   LV e' medial:    12.30 cm/s LV PW:         0.80 cm   LV E/e' medial:  6.0 LV IVS:        0.80 cm   LV e' lateral:   18.10 cm/s LVOT diam:     2.30 cm   LV E/e' lateral: 4.1 LV SV:         81 LV SV Index:   40 LVOT Area:     4.15 cm  RIGHT VENTRICLE RV Basal diam:  3.40 cm LEFT ATRIUM             Index        RIGHT ATRIUM           Index LA diam:        2.80 cm 1.39 cm/m   RA Area:     13.50 cm LA Vol (A2C):   33.9 ml 16.84 ml/m  RA Volume:   34.90 ml  17.34 ml/m LA Vol (A4C):   24.2 ml 12.02 ml/m LA Biplane Vol: 31.3 ml 15.55 ml/m  AORTIC VALVE AV Area (Vmax):    3.50 cm AV Area (Vmean):   3.21 cm AV Area (VTI):     3.63 cm AV Vmax:           120.00 cm/s AV Vmean:          82.000 cm/s AV VTI:            0.222 m AV Peak Grad:      5.8 mmHg AV Mean Grad:      3.0 mmHg LVOT Vmax:         101.00 cm/s LVOT Vmean:        63.400 cm/s LVOT VTI:          0.194 m LVOT/AV VTI ratio: 0.87  AORTA  Ao Root diam: 3.70 cm Ao Asc diam:  2.80 cm MITRAL VALVE MV Area (PHT): 3.42 cm    SHUNTS MV Decel Time: 222 msec    Systemic VTI:  0.19 m MV E velocity: 73.90 cm/s  Systemic Diam: 2.30 cm MV A velocity: 48.20 cm/s MV E/A ratio:  1.53 Lyman Bishop MD Electronically signed by Lyman Bishop MD Signature Date/Time: 01/05/2021/4:55:43 PM    Final     PHYSICAL EXAM  Temp:  [97.6 F (36.4 C)-98.7 F (37.1 C)] 98.4 F (36.9 C) (01/01 1133) Pulse Rate:  [55-96] 96 (01/01 1133) Resp:  [16-18] 16 (01/01 1133) BP: (99-111)/(61-82) 108/82 (01/01 1133) SpO2:  [97 %-100 %] 99 % (01/01 1133)  General - Well nourished, well developed, in no apparent distress. Ophthalmologic - fundi not visualized due to noncooperation. Cardiovascular - Regular rhythm and rate.  Mental Status -  Alert oriented x 3. No aphasia. Cranial Nerves II - XII - II - Visual field intact OU. III, IV, VI - Extraocular movements intact. V - Facial sensation intact bilaterally. VII - Facial movement intact bilaterally. VIII - Hearing & vestibular intact bilaterally. Motor Strength -  non focal. Coordination - no ataxia. Gait and Station - deferred.   ASSESSMENT/PLAN [name] is a 36 y.o. male with history of well-controlled asthma presenting with had uncontrollable, involuntary writhing of his right arm and hand followed by slurred speech which began around 0930 this morning which lasted ~1 minute. He had a somewhat similar episode in character and duration without slurred speech ~about a month ago. Prior to that he had an episode of blurred vision which lasted about several minutes which completely resolved. He states that he is never smoked, used any sort of drugs, or ever had alcohol.  He is not aware of any family history of heart disease or neurological disease.  He states that his diet has not been as good as it used to be and he has not been working out the way he used to be over the last 2 to 3 months.  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  Plan to do TEE, echocardiogram,Venous duplex, EEG, and send out the hypercoagulability panel given unclear source of stroke.  Total cholesterol 209, HDL 49, LDL 146, triglycerides 68. 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 +PFO TEE pending BLE Venous Duplex-no evidence of DVT Hypercoagulability panel- pending   EEG- neg. (He had intermittent right sided sensory changes) Sed Rate- 4 CRP- 1.3 LDL 146 HgbA1c 5.3 VTE prophylaxis - lovenox 40mg  daily No antithrombotic prior to admission, now on aspirin 81 mg daily and clopidogrel 75 mg daily. Continue for 3 weeks Therapy recommendations: No follow-up recommended disposition:  Home  Hypertension Home meds:  none Stable Permissive hypertension (OK if < 220/120) but gradually normalize in 5-7 days Long-term BP goal normotensive  Hyperlipidemia Home meds:  None LDL 146, goal < 70 Add Atorvastatin 40mg   Continue statin at discharge   Hospital day # 2  Cryptogenic left frontal CVA. Young stroke workup pending. NIHSS: 0. Will need loop monitor. +PFO on echo. Outpt TEE and cardiology f/u. DAPT and close f/u in stroke clinic.   Discussed case with Dr. [place] Pink.  Total 15 minutes spent on counseling patient and coordinating care, writing notes and reviewing chart. Pt's questions were answered to his satisfaction.  Daemian Gahm,MD   To contact Stroke Continuity provider, please refer to [email or web address] After hours, contact General Neurology

## 2021-01-07 LAB — BETA-2-GLYCOPROTEIN I ABS, IGG/M/A
Beta-2 Glyco I IgG: <9 GPI IgG units (ref 0–20)
Beta-2-Glycoprotein I IgA: 10 GPI IgA units (ref 0–25)
Beta-2-Glycoprotein I IgM: <9 GPI IgM units (ref 0–32)

## 2021-01-08 LAB — PROTEIN C ACTIVITY: Protein C Activity: 122 % (ref 73–180)

## 2021-01-08 LAB — LUPUS ANTICOAGULANT PANEL
DRVVT: 32.6 s (ref 0.0–47.0)
PTT Lupus Anticoagulant: 41.5 s (ref 0.0–51.9)

## 2021-01-08 LAB — HOMOCYSTEINE: Homocysteine: 9.3 umol/L (ref 0.0–14.5)

## 2021-01-08 LAB — PROTEIN S, TOTAL: Protein S Ag, Total: 98 % (ref 60–150)

## 2021-01-08 LAB — PROTEIN S ACTIVITY: Protein S Activity: 98 % (ref 63–140)

## 2021-01-08 LAB — PROTEIN C, TOTAL: Protein C, Total: 99 % (ref 60–150)

## 2021-01-09 DIAGNOSIS — Q2112 Patent foramen ovale: Secondary | ICD-10-CM

## 2021-01-09 DIAGNOSIS — R29898 Other symptoms and signs involving the musculoskeletal system: Secondary | ICD-10-CM | POA: Diagnosis present

## 2021-01-09 NOTE — Discharge Summary (Signed)
Physician Discharge Summary   Patient: Jimmy Kim MRN: [id number] DOB: 09/13/1985  Admit date:     01/04/2021  Discharge date: 01/06/2021  Discharge Physician: Hollice Espy   PCP: Tally Joe, MD   Recommendations at discharge:  Patient will be following up with cardiology to have a TEE done.  He will also be set up with a 30-day event monitor.  Patient will also follow-up with cardiology if he chooses to have PFO closure. Patient will follow up with neurology in the next 6 weeks New medication: Aspirin 81 mg p.o. daily New medication: Plavix 75 mg p.o. daily x3 weeks New medication: Lipitor 40 mg p.o. daily  Hospital Course   36 year old healthy male with past medical history of mild asthma presented to the emergency room on 12/31 after waking up that morning with right arm weakness and slurred speech, episodes lasting approximately 1 minute.  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  Started on aspirin and Plavix and admitted to hospitalist service for further work-up.    Overnight, started complaining of slurred speech and numb left foot with symptoms resolving.  Found to have LDL of 140 and started on Lipitor.  Echo with bubble study completed noting moderate PFO.  Lower extremity Dopplers done noting no evidence of DVT.  Swallowing study normal.  Seen by neurology and EEG done which was unremarkable.  Seen by seen by cardiology with plans for TEE as outpatient on Wednesday, 1/4.  Discharge Diagnoses Cardiovascular and Mediastinum * Acute CVA (cerebrovascular accident) [facility]) Assessment & Plan Curious in a patient with no risk factors.  Patient is extremely healthy.  Urine drug screen negative.  He did however travel to [facility] biplane in October and then symptoms started approximately a month later.  This can be concerning that he may have a DVT in his lower extremities and with PFO confirmed, I remain suspicious that this was the etiology.  However, no  DVT seen on lower extremities.  I discussed this with neurology who recommended proceeding with TEE and aspirin/Plavix (aspirin indefinitely and Plavix x3 weeks), rather than anticoagulation and event monitor.    Patient indeed found to have hyperlipidemia and will be started on Lipitor.  He eats well and A1c normal.  He did not appear to have any other risk factors.  No family or genetic history.  Anticoagulation studies ordered all of which were normal except for a quantitative IgM anticardiolipin antibody which was in the indeterminate range.  Appreciate neurology help.  CT angiogram of neck notes no significant plaque in carotids bilaterally.  EEG done unremarkable.  Following outpatient TEE, 30-day event monitor.  Respiratory Asthma Assessment & Plan Stable medical issue.  Uses an inhaler perhaps once or twice a year.  Other Hyperlipidemia LDL goal <70 Assessment & Plan Fasting lipid panel notes LDL of 140 with goal of less than 70.  Lipitor 40 started.  Dysarthria Assessment & Plan Secondary to CVA.  Resolved.  Right upper extremity weakness Assessment and plan  Resolved prior to admission.  Seen by PT and OT and cleared.  Patent PFO Assessment and plan  Discussed with patient.  Certainly highly suspicious and suspect could have played a role in his CVA.  For now, recommend patient discuss with interventional cardiology about closure.    Consultants:  -Neurology -Cardiology  Procedures performed:  -2D echo with bubble study noting moderate PFO -Lower extremity Dopplers: Negative for DVT bilaterally  Disposition: Home Diet recommendation:  Cardiac diet  DISCHARGE MEDICATION: Allergies as of 01/06/2021       Reactions   Other Other (See Comments)   Quinoa - intolerance        Medication List     STOP taking these medications    ibuprofen 200 MG tablet Commonly known as: ADVIL       TAKE these medications    albuterol 108 (90 Base) MCG/ACT  inhaler Commonly known as: VENTOLIN HFA Inhale 2 puffs into the lungs every 6 (six) hours as needed for wheezing or shortness of breath.   aspirin 81 MG chewable tablet Chew 1 tablet (81 mg total) by mouth daily.   atorvastatin 40 MG tablet Commonly known as: LIPITOR Take 1 tablet (40 mg total) by mouth daily.   clopidogrel 75 MG tablet Commonly known as: PLAVIX Take 1 tablet (75 mg total) by mouth daily.   Melatonin 2.5 MG Chew Chew 2.5 mg by mouth daily as needed (sleep).   mometasone 50 MCG/ACT nasal spray Commonly known as: NASONEX Place 1 spray into the nose daily as needed (nasal congestion).   OVER THE COUNTER MEDICATION Place 2 drops into both ears daily as needed (for ear pain). Similasan Ear ache relief -Homeopathic medicine        Follow-up Information     [facility] Follow up.   Specialty: Cardiology Why: Our office will call you to help arrange visit or TEE.  Also talk to them about scheduling an appointment with one of the interventional cardiologists to repair the PFO Contact information: [address] [place] [address] [phone]        [facility]. Schedule an appointment as soon as possible for a visit in 6 week(s).   Specialty: Neurology Contact information: [address] [place] [address] [phone]                Discharge Exam: Ceasar Mons Weights   01/04/21 1116  Weight: 79.4 kg   General: Alert and oriented x3, no acute distress Cardiovascular: Regular rate and rhythm, S1-S2 Lungs: Clear to auscultation bilaterally  Condition at discharge: good  The results of significant diagnostics from this hospitalization (including imaging, microbiology, ancillary and laboratory) are listed below for reference.   Imaging Studies: CT ANGIO HEAD NECK W WO CM  Result Date: 01/04/2021 CLINICAL DATA:  Spasms, slurred speech EXAM: CT ANGIOGRAPHY HEAD  AND NECK TECHNIQUE: Multidetector CT imaging of the head and neck was performed using the standard protocol during bolus administration of intravenous contrast. Multiplanar CT image reconstructions and MIPs were obtained to evaluate the vascular anatomy. Carotid stenosis measurements (when applicable) are obtained utilizing NASCET criteria, using the distal internal carotid diameter as the denominator. CONTRAST:  74mL OMNIPAQUE IOHEXOL 350 MG/ML SOLN COMPARISON:  None. FINDINGS: CT HEAD FINDINGS Brain: There is no acute intracranial hemorrhage, extra-axial fluid collection, or acute infarct. Parenchymal volume is normal. The ventricles are normal in size. Gray-white differentiation is preserved. There is no mass lesion.  There is no midline shift. Vascular: No hyperdense vessel or unexpected calcification. Skull: Normal. Negative for fracture or focal lesion. Sinuses: There is minimal mucosal thickening in the maxillary sinuses. Orbits: The globes and orbits are unremarkable. Review of the MIP images confirms the above findings CTA NECK FINDINGS Aortic arch: The aortic arch is normal. The origins of the major branch vessels are patent. Right carotid system: The right common, internal, and external carotid arteries are patent, without hemodynamically significant stenosis  or occlusion. There is no dissection or aneurysm. Left carotid system: The left common, internal, and external carotid arteries are patent, without hemodynamically significant stenosis or occlusion. There is no dissection or aneurysm. Vertebral arteries: The vertebral arteries are codominant. The vertebral arteries are patent, without hemodynamically significant stenosis or occlusion. There is no dissection or aneurysm. Skeleton: There is no acute osseous abnormality or aggressive osseous lesion. Postsurgical changes are noted in the mandible bilaterally. Other neck: The soft tissues are unremarkable. Upper chest: The imaged lung apices are clear.  Review of the MIP images confirms the above findings CTA HEAD FINDINGS Anterior circulation: The intracranial ICAs are patent. The bilateral MCAs are patent. The bilateral ACAs are patent. The anterior communicating artery is patent. There is no aneurysm. Posterior circulation: The bilateral V4 segments are patent. PICA is patent bilaterally. The basilar artery is patent. The bilateral PCAs are patent. The posterior communicating arteries are not identified. There is no aneurysm. Venous sinuses: Patent. Anatomic variants: None. Review of the MIP images confirms the above findings IMPRESSION: 1. No acute intracranial pathology. 2. Normal CTA of the head and neck. Electronically Signed   By: Lesia Hausen M.D.   On: 01/04/2021 12:15   MR Brain W and Wo Contrast  Result Date: 01/04/2021 CLINICAL DATA:  Neuro deficit, acute, stroke suspected EXAM: MRI HEAD WITHOUT AND WITH CONTRAST TECHNIQUE: Multiplanar, multiecho pulse sequences of the brain and surrounding structures were obtained without and with intravenous contrast. CONTRAST:  7mL GADAVIST GADOBUTROL 1 MMOL/ML IV SOLN COMPARISON:  None. FINDINGS: Brain: Small area of reduced diffusion in the left frontal lobe involving the left precentral gyrus this lateral to hand motor region and extending into the centrum semiovale. No evidence of intracranial hemorrhage. There is no intracranial mass, mass effect, or edema. There is no hydrocephalus or extra-axial fluid collection. Ventricles and sulci are normal in size and configuration. No abnormal enhancement. Vascular: Major vessel flow voids at the skull base are preserved. Skull and upper cervical spine: Normal marrow signal is preserved. Sinuses/Orbits: Minor mucosal thickening.  Orbits are unremarkable. Other: Sella is unremarkable.  Mastoid air cells are clear. 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 Electronically Signed   By: Guadlupe Spanish M.D.   On: 01/04/2021 15:11   EEG adult  Result  Date: 01/06/2021 Charlsie Quest, MD     01/06/2021  7:13 AM Patient Name: Jimmy Kim MRN: [id number] Epilepsy Attending: Charlsie Quest Referring Physician/Provider: Elmer Picker, NP Date: 01/05/2021 Duration: 28.27 mins Patient history: 36 y.o. male with history of well-controlled asthma presenting with had uncontrollable, involuntary writhing of his right arm and hand followed by slurred speech which began around 0930 this morning which lasted ~1 minute. He had a somewhat similar episode in character and duration without slurred speech ~about a month ago. EEG to evaluate for seizure Level of alertness: Awake, asleep AEDs during EEG study: None Technical aspects: This EEG study was done with scalp electrodes positioned according to the 10-20 International system of electrode placement. Electrical activity was acquired at a sampling rate of 500Hz  and reviewed with a high frequency filter of 70Hz  and a low frequency filter of 1Hz . EEG data were recorded continuously and digitally stored. Description: The posterior dominant rhythm consists of 10 Hz activity of moderate voltage (25-35 uV) seen predominantly in posterior head regions, symmetric and reactive to eye opening and eye closing. Sleep was characterized by vertex waves, sleep spindles (12 to 14 Hz), maximal frontocentral region. Physiologic  photic driving was seen during photic stimulation.  Hyperventilation was not performed.   IMPRESSION: This study is within normal limits. No seizures or epileptiform discharges were seen throughout the recording. Charlsie Quest   ECHOCARDIOGRAM COMPLETE BUBBLE STUDY  Result Date: 01/05/2021    ECHOCARDIOGRAM REPORT   Patient Name:   Jimmy Kim Date of Exam: 01/05/2021 Medical Rec #:  [id number]       Height:       72.0 in Accession #:    [id number]      Weight:       175.0 lb Date of Birth:  01-05-1986        BSA:          2.013 m Patient Age:    35 years        BP:           111/78 mmHg Patient Gender: M                HR:           72 bpm. Exam Location:  Inpatient Procedure: 2D Echo, Cardiac Doppler, Color Doppler and Saline Contrast Bubble            Study Indications:   Stroke  History:       Patient has no prior history of Echocardiogram examinations.                Asthma.  Sonographer:   Roosvelt Maser RDCS Referring      [address] [facility] Phys: IMPRESSIONS  1. Left ventricular ejection fraction, by estimation, is 60 to 65%. The left ventricle has normal function. The left ventricle has no regional wall motion abnormalities. Left ventricular diastolic parameters were normal.  2. Right ventricular systolic function is normal. The right ventricular size is normal.  3. The mitral valve is grossly normal. No evidence of mitral valve regurgitation.  4. The aortic valve is tricuspid. Aortic valve regurgitation is not visualized.  5. The inferior vena cava is normal in size with greater than 50% respiratory variability, suggesting right atrial pressure of 3 mmHg.  6. Evidence of atrial level shunting detected by color flow Doppler. Agitated saline contrast bubble study was positive with shunting observed within 3-6 cardiac cycles suggestive of interatrial shunt. There is a moderately sized patent foramen ovale with bidirectional shunting across atrial septum. Comparison(s): No prior Echocardiogram. Conclusion(s)/Recommendation(s): Findings concerning for PFO, would recommend Transesophageal Echocardiogram for clarification and evaluation for possible device closure. FINDINGS  Left Ventricle: Left ventricular ejection fraction, by estimation, is 60 to 65%. The left ventricle has normal function. The left ventricle has no regional wall motion abnormalities. The left ventricular internal cavity size was normal in size. There is  no left ventricular hypertrophy. Left ventricular diastolic parameters were normal. Right Ventricle: The right ventricular size is normal. No increase in right ventricular wall thickness. Right  ventricular systolic function is normal. Left Atrium: Left atrial size was normal in size. Right Atrium: Right atrial size was normal in size. Pericardium: There is no evidence of pericardial effusion. Mitral Valve: The mitral valve is grossly normal. No evidence of mitral valve regurgitation. Tricuspid Valve: The tricuspid valve is grossly normal. Tricuspid valve regurgitation is not demonstrated. Aortic Valve: The aortic valve is tricuspid. Aortic valve regurgitation is not visualized. Aortic valve mean gradient measures 3.0 mmHg. Aortic valve peak gradient measures 5.8 mmHg. Aortic valve area, by VTI measures 3.63 cm. Pulmonic Valve: The pulmonic valve was normal in structure.  Pulmonic valve regurgitation is trivial. Aorta: The aortic root and ascending aorta are structurally normal, with no evidence of dilitation. Venous: The inferior vena cava is normal in size with greater than 50% respiratory variability, suggesting right atrial pressure of 3 mmHg. IAS/Shunts: The interatrial septum is aneurysmal. Evidence of atrial level shunting detected by color flow Doppler. Agitated saline contrast was given intravenously to evaluate for intracardiac shunting. Agitated saline contrast bubble study was positive  with shunting observed within 3-6 cardiac cycles suggestive of interatrial shunt. A moderately sized patent foramen ovale is detected with bidirectional shunting across atrial septum.  LEFT VENTRICLE PLAX 2D LVIDd:         4.70 cm   Diastology LVIDs:         3.10 cm   LV e' medial:    12.30 cm/s LV PW:         0.80 cm   LV E/e' medial:  6.0 LV IVS:        0.80 cm   LV e' lateral:   18.10 cm/s LVOT diam:     2.30 cm   LV E/e' lateral: 4.1 LV SV:         81 LV SV Index:   40 LVOT Area:     4.15 cm  RIGHT VENTRICLE RV Basal diam:  3.40 cm LEFT ATRIUM             Index        RIGHT ATRIUM           Index LA diam:        2.80 cm 1.39 cm/m   RA Area:     13.50 cm LA Vol (A2C):   33.9 ml 16.84 ml/m  RA Volume:    34.90 ml  17.34 ml/m LA Vol (A4C):   24.2 ml 12.02 ml/m LA Biplane Vol: 31.3 ml 15.55 ml/m  AORTIC VALVE AV Area (Vmax):    3.50 cm AV Area (Vmean):   3.21 cm AV Area (VTI):     3.63 cm AV Vmax:           120.00 cm/s AV Vmean:          82.000 cm/s AV VTI:            0.222 m AV Peak Grad:      5.8 mmHg AV Mean Grad:      3.0 mmHg LVOT Vmax:         101.00 cm/s LVOT Vmean:        63.400 cm/s LVOT VTI:          0.194 m LVOT/AV VTI ratio: 0.87  AORTA Ao Root diam: 3.70 cm Ao Asc diam:  2.80 cm MITRAL VALVE MV Area (PHT): 3.42 cm    SHUNTS MV Decel Time: 222 msec    Systemic VTI:  0.19 m MV E velocity: 73.90 cm/s  Systemic Diam: 2.30 cm MV A velocity: 48.20 cm/s MV E/A ratio:  1.53 Zoila ShutterKenneth Hilty MD Electronically signed by Zoila ShutterKenneth Hilty MD Signature Date/Time: 01/05/2021/4:55:43 PM    Final    VAS US LOWER EXTREMITY VENOUS (DVT)  Result Date: 01/05/2021  Lower Venous DVT Study Patient Name:  Jimmy Kim  Date of Exam:   01/05/2021 Medical Rec #: [id number]        Accession #:    [id number] Date of Birth: 02/17/1985         Patient Gender: M Patient Age:   [age] years [facility] Procedure:      VAS US LOWER EXTREMITY VENOUS (DVT) Referring Phys: Virginia RochesterSENDIL Christophe Rising --------------------------------------------------------------------------------  Indications: Stroke.  Comparison Study: no prior Performing Technologist: [place] PonderMegan Stricklin RVS  Examination Guidelines: A complete evaluation includes B-mode imaging, spectral Doppler, color Doppler, and power Doppler as needed of all accessible portions of each vessel. Bilateral testing is considered an integral part of a complete examination. Limited examinations for reoccurring indications may be performed as noted. The reflux portion of the exam is performed with the patient in reverse Trendelenburg.  +---------+---------------+---------+-----------+----------+--------------+  RIGHT     Compressibility Phasicity Spontaneity Properties Thrombus  Aging  +---------+---------------+---------+-----------+----------+--------------+  CFV       Full            Yes       Yes                                    +---------+---------------+---------+-----------+----------+--------------+  SFJ       Full                                                             +---------+---------------+---------+-----------+----------+--------------+  FV Prox   Full                                                             +---------+---------------+---------+-----------+----------+--------------+  FV Mid    Full                                                             +---------+---------------+---------+-----------+----------+--------------+  FV Distal Full                                                             +---------+---------------+---------+-----------+----------+--------------+  PFV       Full                                                             +---------+---------------+---------+-----------+----------+--------------+  POP       Full            Yes       Yes                                    +---------+---------------+---------+-----------+----------+--------------+  PTV       Full                                                             +---------+---------------+---------+-----------+----------+--------------+  PERO      Full                                                             +---------+---------------+---------+-----------+----------+--------------+   +---------+---------------+---------+-----------+----------+--------------+  LEFT      Compressibility Phasicity Spontaneity Properties Thrombus Aging  +---------+---------------+---------+-----------+----------+--------------+  CFV       Full            Yes       Yes                                    +---------+---------------+---------+-----------+----------+--------------+  SFJ       Full                                                              +---------+---------------+---------+-----------+----------+--------------+  FV Prox   Full                                                             +---------+---------------+---------+-----------+----------+--------------+  FV Mid    Full                                                             +---------+---------------+---------+-----------+----------+--------------+  FV Distal Full                                                             +---------+---------------+---------+-----------+----------+--------------+  PFV       Full                                                             +---------+---------------+---------+-----------+----------+--------------+  POP       Full            Yes       Yes                                    +---------+---------------+---------+-----------+----------+--------------+  PTV       Full                                                             +---------+---------------+---------+-----------+----------+--------------+  PERO      Full                                                             +---------+---------------+---------+-----------+----------+--------------+     Summary: BILATERAL: - No evidence of deep vein thrombosis seen in the lower extremities, bilaterally. -No evidence of popliteal cyst, bilaterally.   *See table(s) above for measurements and observations. Electronically signed by Sherald Hess MD on 01/05/2021 at 11:35:47 AM.    Final     Microbiology: Results for orders placed or performed during the hospital encounter of 01/04/21  Resp Panel by RT-PCR (Flu A&B, Covid) Nasopharyngeal Swab     Status: None   Collection Time: 01/04/21 11:54 AM   Specimen: Nasopharyngeal Swab; Nasopharyngeal(NP) swabs in vial transport medium  Result Value Ref Range Status   SARS Coronavirus 2 by RT PCR NEGATIVE NEGATIVE Final    Comment: (NOTE) SARS-CoV-2 target nucleic acids are NOT DETECTED.  The SARS-CoV-2 RNA is generally detectable in upper  respiratory specimens during the acute phase of infection. The lowest concentration of SARS-CoV-2 viral copies this assay can detect is 138 copies/mL. A negative result does not preclude SARS-Cov-2 infection and should not be used as the sole basis for treatment or other patient management decisions. A negative result may occur with  improper specimen collection/handling, submission of specimen other than nasopharyngeal swab, presence of viral mutation(s) within the areas targeted by this assay, and inadequate number of viral copies(<138 copies/mL). A negative result must be combined with clinical observations, patient history, and epidemiological information. The expected result is Negative.  Fact Sheet for Patients:  BloggerCourse.com  Fact Sheet for Healthcare Providers:  SeriousBroker.it  This test is no t yet approved or cleared by the [place] FDA and  has been authorized for detection and/or diagnosis of SARS-CoV-2 by FDA under an Emergency Use Authorization (EUA). This EUA will remain  in effect (meaning this test can be used) for the duration of the COVID-19 declaration under Section 564(b)(1) of the Act, 21 U.S.C.section 360bbb-3(b)(1), unless the authorization is terminated  or revoked sooner.       Influenza A by PCR NEGATIVE NEGATIVE Final   Influenza B by PCR NEGATIVE NEGATIVE Final    Comment: (NOTE) The Xpert Xpress SARS-CoV-2/FLU/RSV plus assay is intended as an aid in the diagnosis of influenza from Nasopharyngeal swab specimens and should not be used as a sole basis for treatment. Nasal washings and aspirates are unacceptable for Xpert Xpress SARS-CoV-2/FLU/RSV testing.  Fact Sheet for Patients: BloggerCourse.com  Fact Sheet for Healthcare Providers: SeriousBroker.it  This test is not yet approved or cleared by the [place] FDA and has been  authorized for detection and/or diagnosis of SARS-CoV-2 by FDA under an Emergency Use Authorization (EUA). This EUA will remain in effect (meaning this test can be used) for the duration of the COVID-19 declaration under Section 564(b)(1) of the Act, 21 U.S.C. section 360bbb-3(b)(1), unless the authorization is terminated or revoked.  Performed at [facility], [address], [place], [place] [address]     Labs: CBC: Recent Labs  Lab 01/04/21 1113 01/04/21 1124 01/05/21 0204  WBC 6.7  --  7.3  NEUTROABS 4.6  --   --   HGB 14.4 15.6 14.1  HCT 44.7 46.0  42.7  MCV 81.9  --  80.6  PLT 301  --  254   Basic Metabolic Panel: Recent Labs  Lab 01/04/21 1113 01/04/21 1124 01/05/21 0204  NA 139 140  --   K 3.7 3.6  --   CL 104 102  --   CO2 27  --   --   GLUCOSE 94 89  --   BUN 15 21*  --   CREATININE 1.02 0.90 1.05  CALCIUM 9.2  --   --    Liver Function Tests: Recent Labs  Lab 01/04/21 1113  AST 25  ALT 37  ALKPHOS 65  BILITOT 0.8  PROT 7.0  ALBUMIN 4.3   CBG: Recent Labs  Lab 01/04/21 1101  GLUCAP 107*    Discharge time spent: greater than 40 minutes.  Signed: Hollice Espy, MD [facility] 01/09/2021

## 2021-01-09 NOTE — Assessment & Plan Note (Signed)
Discovered on 2D echo with bubble study.  Recommending outpatient surgical closure.  Patient will follow-up with interventional cardiology

## 2021-01-10 NOTE — Progress Notes (Signed)
Cardiology Office Note:    Date:  01/11/2021   ID:  Jimmy Kim, DOB 02/08/1985, MRN [id number]  PCP:  Jimmy JoeSwayne, David, MD  Cardiologist:  Norman HerrlichBrian Elysia Grand, MD   Referring MD: Jimmy JoeSwayne, David, MD  ASSESSMENT:    1. Acute CVA (cerebrovascular accident) (HCC)   2. PFO (patent foramen ovale)   3. Hyperlipidemia LDL goal <70    PLAN:    In order of problems listed above:  Truly appears to have stroke cardiac embolic related to PFO and demonstrable shunt I will go ahead and set him up for his TEE and arrangements for follow-up with Dr. Calton DachMike Cooper regarding consideration of closure percutaneous or surgical.  In the interim continue dual antiplatelet and high intensity statin I will make arranges follow-up lipids LP(a) in about 4 weeks and I will have him follow-up with Dr. Excell Seltzerooper from this point forward although I am happy to see him back at any time in the future and encouraged him to sign up with my chart and send any messages to me He questions if he needs to remain on a statin I told him as part of the standard treatment in the future however could consider withdrawal and reassessment of fasting baseline lipids often distorted on initial hospital assay  Next appointment as needed after further evaluation and treatment of PFO   Medication Adjustments/Labs and Tests Ordered: Current medicines are reviewed at length with the patient today.  Concerns regarding medicines are outlined above.  No orders of the defined types were placed in this encounter.  No orders of the defined types were placed in this encounter.    Chief Complaint  Patient presents with   Follow-up    He had a recent stroke was found to have abnormality with a PFO and biventricular atrial shunting and for review of records appears to have been scheduled for TEE.    History of Present Illness:    Jimmy Kim is a 36 y.o. male who is being seen today for the evaluation of cardiac etiology of stroke.  He had a recent  ad[facility] to [facility] with a left frontal lobe infarct likely secondary to possible embolic etiology CTA head and neck showed no pathology MRI showed small acute infarct left frontal lobe 2D echo showed patent foramen ovale with shunting by agitated contrast injection lower extremity duplex showed no findings of thrombophlebitis hypercoagulable panel panel was unremarkable inflammatory markers sedimentation rate CRP were normal he had significant hyperlipidemia with LDL 146 was placed on a statin and his hemoglobin A1c was normal.  He was treated with antiplatelet therapy aspirin and clopidogrel.  There is a notation at discharge that he is scheduled for transesophageal echocardiogram 01/09/2021 that does not appear to have been done.  He was seen by one of the hospital PA staff for scheduling of the transesophageal echocardiogram at the request of Jimmy JoeSwayne, David, MD.   Echo 01/05/2021:  1. Left ventricular ejection fraction, by estimation, is 60 to 65%. The  left ventricle has normal function. The left ventricle has no regional  wall motion abnormalities. Left ventricular diastolic parameters were  normal.   2. Right ventricular systolic function is normal. The right ventricular  size is normal.   3. The mitral valve is grossly normal. No evidence of mitral valve  regurgitation.   4. The aortic valve is tricuspid. Aortic valve regurgitation is not  visualized.   5. The inferior vena cava is normal in size with greater than  50%  respiratory variability, suggesting right atrial pressure of 3 mmHg.   6. Evidence of atrial level shunting detected by color flow Doppler.  Agitated saline contrast bubble study was positive with shunting observed  within 3-6 cardiac cycles suggestive of interatrial shunt. There is a  moderately sized patent foramen ovale  with bidirectional shunting across atrial septum.   His ROPE score is 9-88% probability PFO attributable stroke  Unfortunately being  discharged from the hospital he did not have a TEE performed Since discharge he has made a full neurologic recovery except at times he struggles for word but this has been a long-term problem and has had some vague lightheadedness No chest pain shortness of breath palpitation or syncope tolerates aspirin and clopidogrel without bleeding and his statin. He has had several episodes in the last 6 months where he had transient weakness in the left upper extremity and likely has had TIA in the past Past Medical History:  Diagnosis Date   Asthma     Past Surgical History:  Procedure Laterality Date   MANDIBLE SURGERY      Current Medications: Current Meds  Medication Sig   albuterol (VENTOLIN HFA) 108 (90 Base) MCG/ACT inhaler Inhale 2 puffs into the lungs every 6 (six) hours as needed for wheezing or shortness of breath.   aspirin 81 MG chewable tablet Chew 1 tablet (81 mg total) by mouth daily.   atorvastatin (LIPITOR) 40 MG tablet Take 1 tablet (40 mg total) by mouth daily.   clopidogrel (PLAVIX) 75 MG tablet Take 1 tablet (75 mg total) by mouth daily.   Melatonin 2.5 MG CHEW Chew 2.5 mg by mouth daily as needed (sleep).   mometasone (NASONEX) 50 MCG/ACT nasal spray Place 1 spray into the nose daily as needed (nasal congestion).   OVER THE COUNTER MEDICATION Place 2 drops into both ears daily as needed (for ear pain). Similasan Ear ache relief -Homeopathic medicine     Allergies:   Other   Social History   Socioeconomic History   Marital status: Married    Spouse name: Not on file   Number of children: Not on file   Years of education: Not on file   Highest education level: Not on file  Occupational History   Not on file  Tobacco Use   Smoking status: Never    Passive exposure: Never   Smokeless tobacco: Never  Vaping Use   Vaping Use: Never used  Substance and Sexual Activity   Alcohol use: Never   Drug use: Never   Sexual activity: Yes    Birth control/protection:  Condom  Other Topics Concern   Not on file  Social History Narrative   Not on file   Social Determinants of Health   Financial Resource Strain: Not on file  Food Insecurity: Not on file  Transportation Needs: Not on file  Physical Activity: Not on file  Stress: Not on file  Social Connections: Not on file     Family History: The patient's family history includes Healthy in his father; Neurologic Disorder in his mother; Stroke in his paternal grandfather.  ROS:   ROS Please see the history of present illness.     All other systems reviewed and are negative.  EKGs/Labs/Other Studies Reviewed:    The following studies were reviewed today:   Recent Labs: 01/04/2021: ALT 37; BUN 21; Potassium 3.6; Sodium 140 01/05/2021: Creatinine, Ser 1.05; Hemoglobin 14.1; Platelets 254  Recent Lipid Panel    Component Value Date/Time  CHOL 209 (H) 01/05/2021 0204   TRIG 68 01/05/2021 0204   HDL 49 01/05/2021 0204   CHOLHDL 4.3 01/05/2021 0204   VLDL 14 01/05/2021 0204   LDLCALC 146 (H) 01/05/2021 0204    Physical Exam:    VS:  BP 110/71 (BP Location: Left Arm)    Pulse 70    Ht 6' (1.829 m)    Wt 178 lb (80.7 kg)    SpO2 99%    BMI 24.14 kg/m     Wt Readings from Last 3 Encounters:  01/11/21 178 lb (80.7 kg)  01/04/21 175 lb (79.4 kg)     GEN:  Well nourished, well developed in no acute distress HEENT: Normal NECK: No JVD; No carotid bruits LYMPHATICS: No lymphadenopathy CARDIAC: RRR, no murmurs, rubs, gallops RESPIRATORY:  Clear to auscultation without rales, wheezing or rhonchi  ABDOMEN: Soft, non-tender, non-distended MUSCULOSKELETAL:  No edema; No deformity  SKIN: Warm and dry NEUROLOGIC:  Alert and oriented x 3 PSYCHIATRIC:  Normal affect     Signed, Norman Herrlich, MD  01/11/2021 10:12 AM    [facility]

## 2021-01-11 ENCOUNTER — Other Ambulatory Visit: Payer: Self-pay

## 2021-01-11 ENCOUNTER — Encounter: Payer: Self-pay | Admitting: Cardiology

## 2021-01-11 ENCOUNTER — Telehealth: Payer: Self-pay

## 2021-01-11 ENCOUNTER — Ambulatory Visit (INDEPENDENT_AMBULATORY_CARE_PROVIDER_SITE_OTHER): Payer: BC Managed Care – PPO | Admitting: Cardiology

## 2021-01-11 VITALS — BP 110/71 | HR 70 | Ht 72.0 in | Wt 178.0 lb

## 2021-01-11 DIAGNOSIS — Q2112 Patent foramen ovale: Secondary | ICD-10-CM

## 2021-01-11 DIAGNOSIS — E785 Hyperlipidemia, unspecified: Secondary | ICD-10-CM | POA: Diagnosis not present

## 2021-01-11 DIAGNOSIS — I639 Cerebral infarction, unspecified: Secondary | ICD-10-CM | POA: Diagnosis not present

## 2021-01-11 LAB — FACTOR 5 LEIDEN

## 2021-01-11 NOTE — Patient Instructions (Addendum)
Medication Instructions:  Your physician recommends that you continue on your current medications as directed. Please refer to the Current Medication list given to you today.  *If you need a refill on your cardiac medications before your next appointment, please call your pharmacy*   Lab Work: Your physician recommends that you return for lab work in: 02/04/21-02/06/21 CMP, CBC, LIPIDS, LPA If you have labs (blood work) drawn today and your tests are completely normal, you will receive your results only by: MyChart Message (if you have MyChart) OR A paper copy in the mail If you have any lab test that is abnormal or we need to change your treatment, we will call you to review the results.   Testing/Procedures: You are scheduled for a TEE on 02/08/2021 with Dr. Cristal Deer.  Please arrive at the [facility] (Main Entrance A) at [facility]: [address] [place], [place] [address] at 6:30 am. (1 hour prior to procedure)  DIET: Nothing to eat or drink after midnight except a sip of water with medications (see medication instructions below)  FYI: For your safety, and to allow [place] to monitor your vital signs accurately during the surgery/procedure we request that   if you have artificial nails, gel coating, SNS etc. Please have those removed prior to your surgery/procedure. Not having the nail coverings /polish removed may result in cancellation or delay of your surgery/procedure.   Medication Instructions:  You can take all of your medications prior to the procedure.    Labs: You will need your labs drawn between 02/04/21-02/06/21  You must have a responsible person to drive you home and stay in the waiting area during your procedure. Failure to do so could result in cancellation.  Bring your insurance cards.  *Special Note: Every effort is made to have your procedure done on time. Occasionally there are emergencies that occur at the hospital that may cause delays. Please be  patient if a delay does occur.     Follow-Up: At [facility], you and your health needs are our priority.  As part of our continuing mission to provide you with exceptional heart care, we have created designated Provider Care Teams.  These Care Teams include your primary Cardiologist (physician) and Advanced Practice Providers (APPs -  Physician Assistants and Nurse Practitioners) who all work together to provide you with the care you need, when you need it.  We recommend signing up for the patient portal called "MyChart".  Sign up information is provided on this After Visit Summary.  MyChart is used to connect with patients for Virtual Visits (Telemedicine).  Patients are able to view lab/test results, encounter notes, upcoming appointments, etc.  Non-urgent messages can be sent to your provider as well.   To learn more about what you can do with MyChart, go to [email or web address].    Your next appointment:   Follow up with Dr. Excell Seltzer  The format for your next appointment:   In Person  Provider:   Norman Herrlich, MD    Other Instructions

## 2021-01-11 NOTE — Telephone Encounter (Signed)
Scheduled the patient 03/01/2021 with Dr. Excell Seltzer. He was grateful for call and agrees with plan.

## 2021-01-11 NOTE — Telephone Encounter (Signed)
Per Dr. Dulce Sellar, called patient to schedule PFO consult with Dr. Excell Seltzer. TEE is scheduled 02/08/21 and consult will need to be scheduled after that appointment.  Left message to call back.

## 2021-01-14 LAB — PROTHROMBIN GENE MUTATION

## 2021-01-17 ENCOUNTER — Emergency Department (HOSPITAL_COMMUNITY): Payer: BC Managed Care – PPO

## 2021-01-17 ENCOUNTER — Other Ambulatory Visit: Payer: Self-pay

## 2021-01-17 ENCOUNTER — Encounter (HOSPITAL_COMMUNITY): Payer: Self-pay

## 2021-01-17 ENCOUNTER — Emergency Department (HOSPITAL_COMMUNITY)
Admission: EM | Admit: 2021-01-17 | Discharge: 2021-01-17 | Disposition: A | Discharge status: Home / Self Care | Payer: BC Managed Care – PPO | Attending: Emergency Medicine | Admitting: Emergency Medicine

## 2021-01-17 DIAGNOSIS — R519 Headache, unspecified: Secondary | ICD-10-CM | POA: Diagnosis not present

## 2021-01-17 DIAGNOSIS — F419 Anxiety disorder, unspecified: Secondary | ICD-10-CM | POA: Diagnosis not present

## 2021-01-17 DIAGNOSIS — Z20822 Contact with and (suspected) exposure to covid-19: Secondary | ICD-10-CM | POA: Diagnosis not present

## 2021-01-17 DIAGNOSIS — R42 Dizziness and giddiness: Secondary | ICD-10-CM | POA: Diagnosis not present

## 2021-01-17 DIAGNOSIS — Z79899 Other long term (current) drug therapy: Secondary | ICD-10-CM | POA: Insufficient documentation

## 2021-01-17 DIAGNOSIS — F410 Panic disorder [episodic paroxysmal anxiety] without agoraphobia: Secondary | ICD-10-CM | POA: Diagnosis not present

## 2021-01-17 DIAGNOSIS — Z7982 Long term (current) use of aspirin: Secondary | ICD-10-CM | POA: Diagnosis not present

## 2021-01-17 DIAGNOSIS — Z8673 Personal history of transient ischemic attack (TIA), and cerebral infarction without residual deficits: Secondary | ICD-10-CM

## 2021-01-17 DIAGNOSIS — R531 Weakness: Secondary | ICD-10-CM | POA: Insufficient documentation

## 2021-01-17 DIAGNOSIS — R9431 Abnormal electrocardiogram [ECG] [EKG]: Secondary | ICD-10-CM | POA: Diagnosis not present

## 2021-01-17 DIAGNOSIS — I639 Cerebral infarction, unspecified: Secondary | ICD-10-CM | POA: Diagnosis not present

## 2021-01-17 DIAGNOSIS — R29818 Other symptoms and signs involving the nervous system: Secondary | ICD-10-CM | POA: Diagnosis not present

## 2021-01-17 HISTORY — DX: Cerebral infarction, unspecified: I63.9

## 2021-01-17 LAB — I-STAT CHEM 8, ED
BUN: 24 mg/dL — ABNORMAL HIGH (ref 6–20)
Calcium, Ion: 1.08 mmol/L — ABNORMAL LOW (ref 1.15–1.40)
Chloride: 103 mmol/L (ref 98–111)
Creatinine, Ser: 1 mg/dL (ref 0.61–1.24)
Glucose, Bld: 126 mg/dL — ABNORMAL HIGH (ref 70–99)
HCT: 46 % (ref 39.0–52.0)
Hemoglobin: 15.6 g/dL (ref 13.0–17.0)
Potassium: 4 mmol/L (ref 3.5–5.1)
Sodium: 139 mmol/L (ref 135–145)
TCO2: 25 mmol/L (ref 22–32)

## 2021-01-17 LAB — URINALYSIS, ROUTINE W REFLEX MICROSCOPIC
Bilirubin Urine: NEGATIVE
Glucose, UA: NEGATIVE mg/dL
Hgb urine dipstick: NEGATIVE
Ketones, ur: NEGATIVE mg/dL
Leukocytes,Ua: NEGATIVE
Nitrite: NEGATIVE
Protein, ur: NEGATIVE mg/dL
Specific Gravity, Urine: 1.012 (ref 1.005–1.030)
pH: 7 (ref 5.0–8.0)

## 2021-01-17 LAB — CBC
HCT: 45.3 % (ref 39.0–52.0)
Hemoglobin: 14.7 g/dL (ref 13.0–17.0)
MCH: 26.5 pg (ref 26.0–34.0)
MCHC: 32.5 g/dL (ref 30.0–36.0)
MCV: 81.6 fL (ref 80.0–100.0)
Platelets: 271 10*3/uL (ref 150–400)
RBC: 5.55 MIL/uL (ref 4.22–5.81)
RDW: 12.2 % (ref 11.5–15.5)
WBC: 7.4 10*3/uL (ref 4.0–10.5)
nRBC: 0 % (ref 0.0–0.2)

## 2021-01-17 LAB — COMPREHENSIVE METABOLIC PANEL
ALT: 66 U/L — ABNORMAL HIGH (ref 0–44)
AST: 46 U/L — ABNORMAL HIGH (ref 15–41)
Albumin: 4.5 g/dL (ref 3.5–5.0)
Alkaline Phosphatase: 73 U/L (ref 38–126)
Anion gap: 12 (ref 5–15)
BUN: 18 mg/dL (ref 6–20)
CO2: 25 mmol/L (ref 22–32)
Calcium: 9.4 mg/dL (ref 8.9–10.3)
Chloride: 103 mmol/L (ref 98–111)
Creatinine, Ser: 1.01 mg/dL (ref 0.61–1.24)
GFR, Estimated: >60 mL/min (ref >60)
Glucose, Bld: 130 mg/dL — ABNORMAL HIGH (ref 70–99)
Potassium: 3.7 mmol/L (ref 3.5–5.1)
Sodium: 140 mmol/L (ref 135–145)
Total Bilirubin: 0.7 mg/dL (ref 0.3–1.2)
Total Protein: 7.6 g/dL (ref 6.5–8.1)

## 2021-01-17 LAB — RAPID URINE DRUG SCREEN, HOSP PERFORMED
Amphetamines: NOT DETECTED
Barbiturates: NOT DETECTED
Benzodiazepines: NOT DETECTED
Cocaine: NOT DETECTED
Opiates: NOT DETECTED
Tetrahydrocannabinol: NOT DETECTED

## 2021-01-17 LAB — CBG MONITORING, ED: Glucose-Capillary: 140 mg/dL — ABNORMAL HIGH (ref 70–99)

## 2021-01-17 LAB — DIFFERENTIAL
Abs Immature Granulocytes: 0.02 10*3/uL (ref 0.00–0.07)
Basophils Absolute: 0 10*3/uL (ref 0.0–0.1)
Basophils Relative: 1 %
Eosinophils Absolute: 0.2 10*3/uL (ref 0.0–0.5)
Eosinophils Relative: 2 %
Immature Granulocytes: 0 %
Lymphocytes Relative: 27 %
Lymphs Abs: 2 10*3/uL (ref 0.7–4.0)
Monocytes Absolute: 0.5 10*3/uL (ref 0.1–1.0)
Monocytes Relative: 7 %
Neutro Abs: 4.7 10*3/uL (ref 1.7–7.7)
Neutrophils Relative %: 63 %

## 2021-01-17 LAB — RESP PANEL BY RT-PCR (FLU A&B, COVID) ARPGX2
Influenza A by PCR: NEGATIVE
Influenza B by PCR: NEGATIVE
SARS Coronavirus 2 by RT PCR: NEGATIVE

## 2021-01-17 LAB — ETHANOL: Alcohol, Ethyl (B): <10 mg/dL (ref <10)

## 2021-01-17 LAB — PROTIME-INR
INR: 1 (ref 0.8–1.2)
Prothrombin Time: 12.8 seconds (ref 11.4–15.2)

## 2021-01-17 LAB — APTT: aPTT: 29 seconds (ref 24–36)

## 2021-01-17 IMAGING — MR MR HEAD W/O CM
8 of 10 series · 36 of 48 positions shown · non-contrast
Comparison: CT head obtained earlier the same day, brain MRI
[DATE]

CLINICAL DATA: Dizziness, weakness in the arms and loss of
coordination

EXAM:
MRI HEAD WITHOUT CONTRAST
TECHNIQUE: Multiplanar, multiecho pulse sequences of the brain and surrounding
structures were obtained without intravenous contrast.

[Series 3: DWI · axial · 3.0mm · 1.09mm/px · z∈[-53,+105]mm · 9 of 108 slices shown (1 of 4)]
[im 1/108]
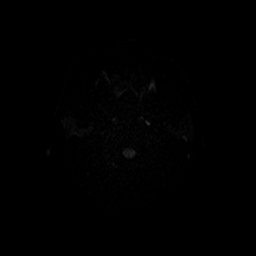
[im 14/108]
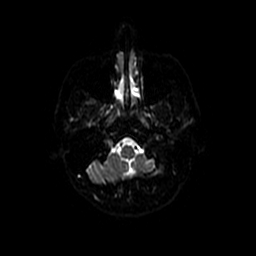
[im 27/108]
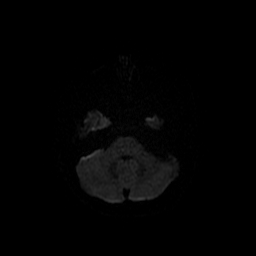
[im 41/108]
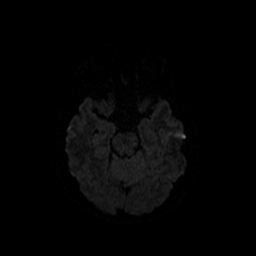
[im 54/108]
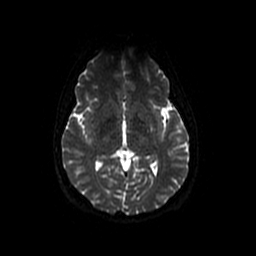
[im 67/108]
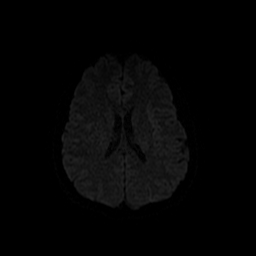
[im 81/108]
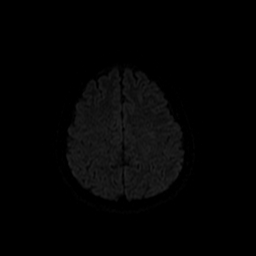
[im 94/108]
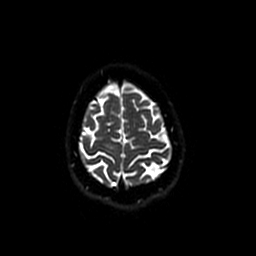
[im 108/108]
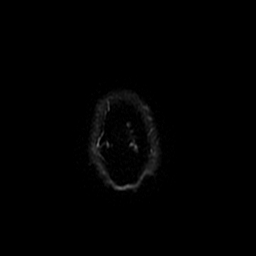

[Series 4: DWI · coronal · 5.0mm · 1.09mm/px · 7 of 78 slices shown (2 of 4)]
[im 1/78]
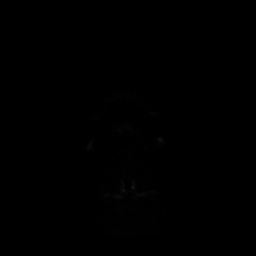
[im 13/78]
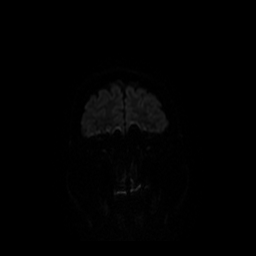
[im 26/78]
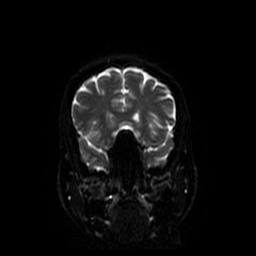
[im 39/78]
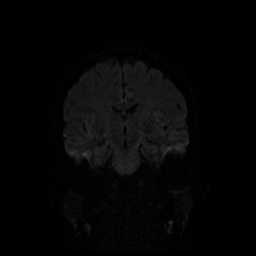
[im 52/78]
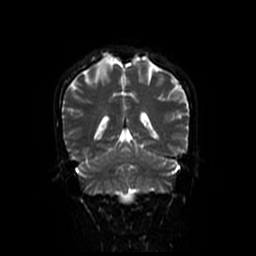
[im 65/78]
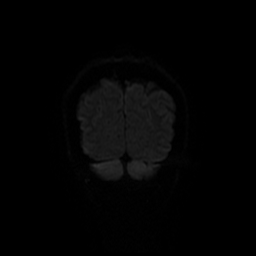
[im 78/78]
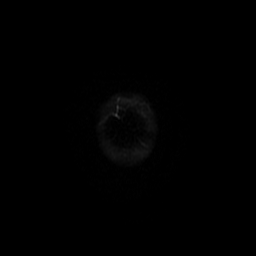

[Series 5: T1 · sagittal · 5.0mm · 0.47mm/px · 2 of 26 slices shown]
[im 1/26]
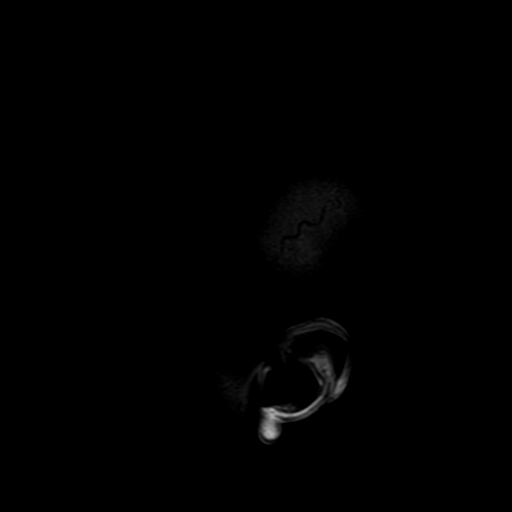
[im 26/26]
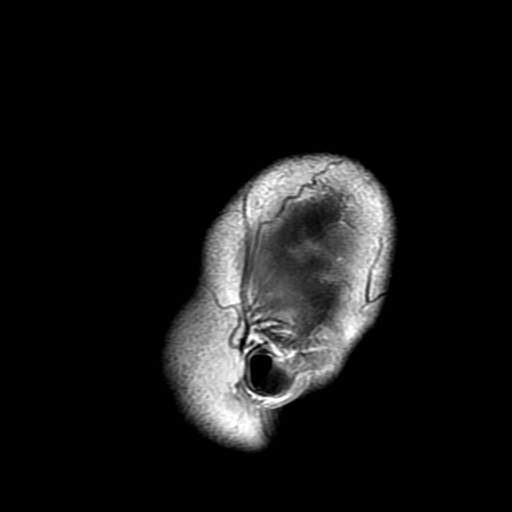

[Series 6: T2 · axial · 5.0mm · 0.47mm/px · z∈[-58,+101]mm · 3 of 28 slices shown (1 of 2)]
[im 1/28]
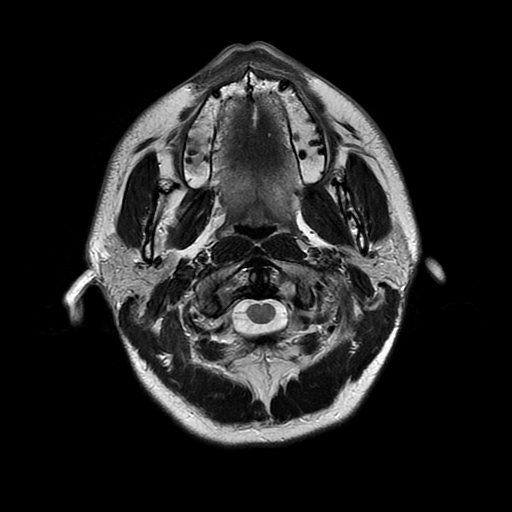
[im 14/28]
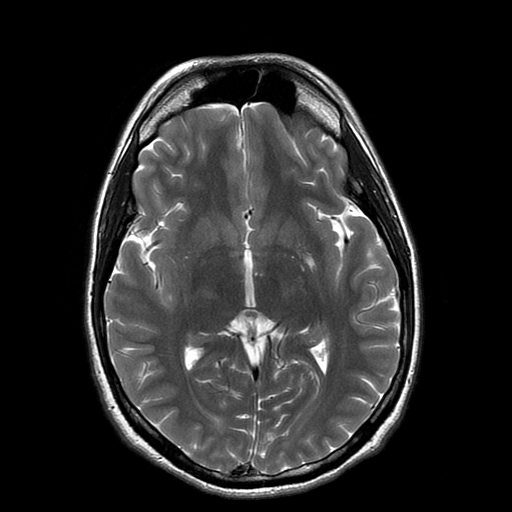
[im 28/28]
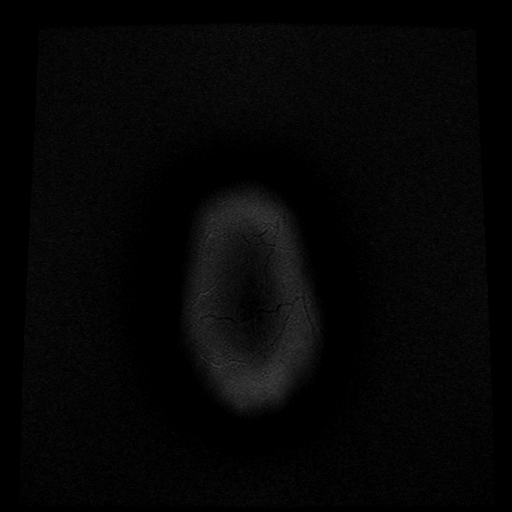

[Series 7: FLAIR · axial · 3.0mm · 0.47mm/px · z∈[-58,+101]mm · 3 of 28 slices shown]
[im 1/28]
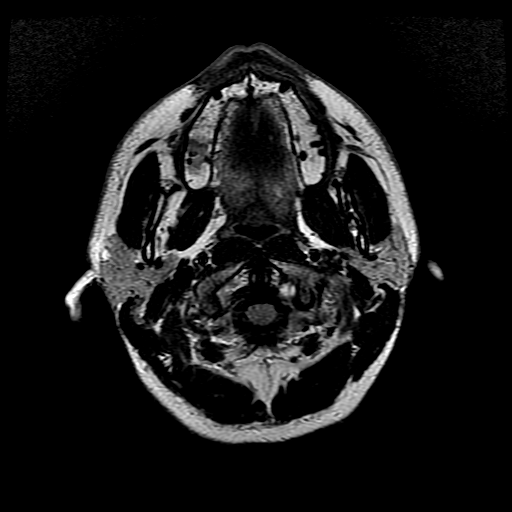
[im 14/28]
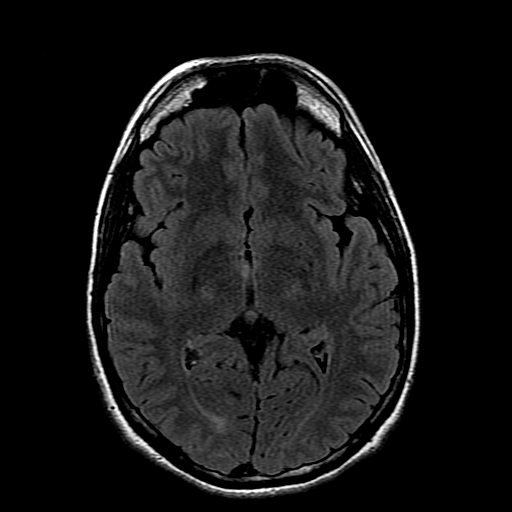
[im 28/28]
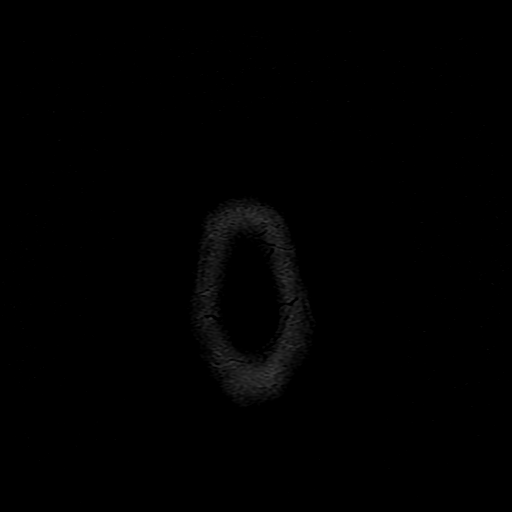

[Series 10: T2 · coronal · 5.0mm · 0.39mm/px · 3 of 30 slices shown (2 of 2)]
[im 1/30]
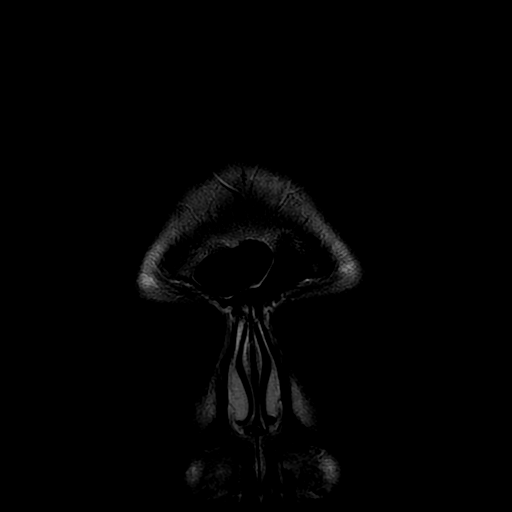
[im 15/30]
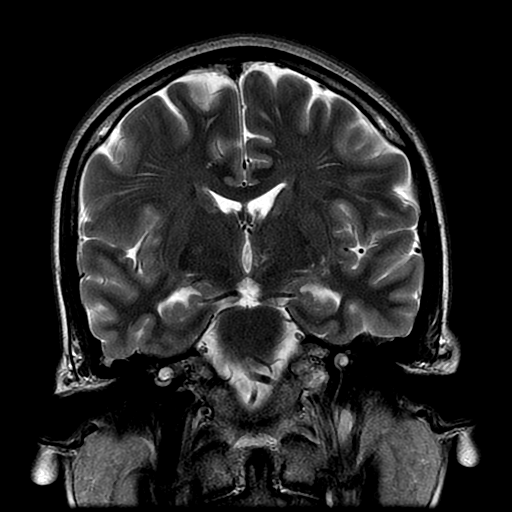
[im 30/30]
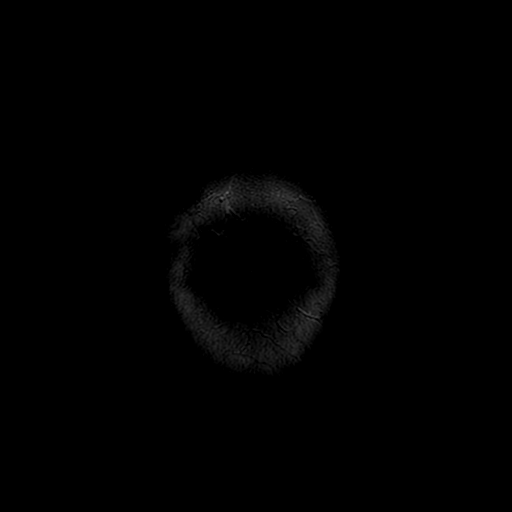

[Series 300: DWI · axial · 3.0mm · 1.09mm/px · z∈[-53,+105]mm · 5 of 54 slices shown (3 of 4)]
[im 1/54]
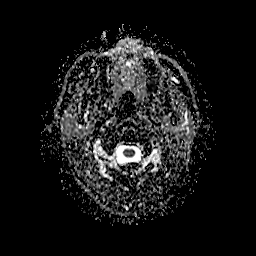
[im 14/54]
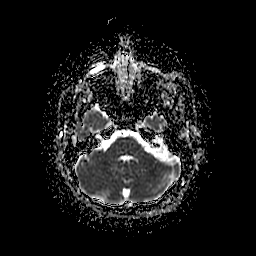
[im 27/54]
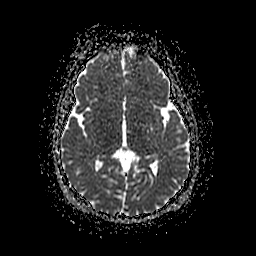
[im 40/54]
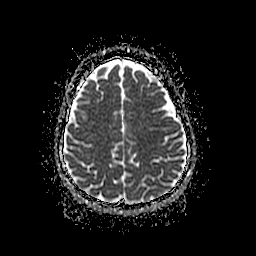
[im 54/54]
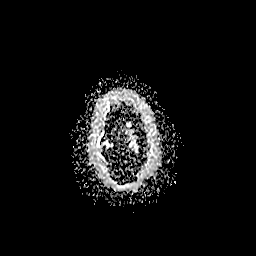

[Series 400: DWI · coronal · 5.0mm · 1.09mm/px · 4 of 39 slices shown (4 of 4)]
[im 1/39]
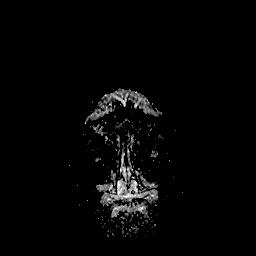
[im 13/39]
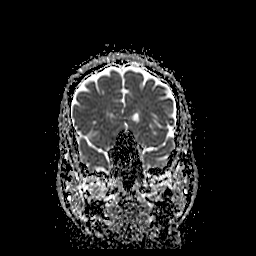
[im 26/39]
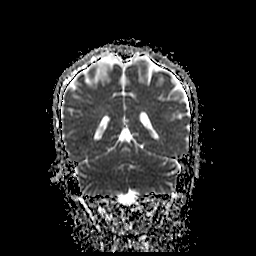
[im 39/39]
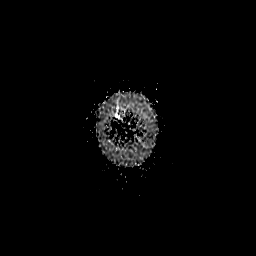

[36 of 48 positions shown; findings below may reference images not displayed]

FINDINGS: Brain: Previously seen diffusion restriction in the left frontal
lobe has resolved. There is no appreciable evolved FLAIR signal
abnormality. There is no evidence of new acute infarct. There is no
evidence of acute intracranial hemorrhage, extra-axial fluid
collection, or acute infarct.

Parenchymal volume is normal. The ventricles are normal in size.
There is no suspicious parenchymal signal abnormality.

There is no mass lesion.  There is no midline shift.

Vascular: Normal flow voids.

Skull and upper cervical spine: Normal marrow signal.

Sinuses/Orbits: The paranasal sinuses are clear. The globes and
orbits are unremarkable.

Other: None.
IMPRESSION: 1. Previously seen diffusion restriction in the left frontal lobe
has resolved.
2. No evidence of new acute infarct or other acute intracranial
pathology.

## 2021-01-17 IMAGING — CT CT HEAD CODE STROKE
2 of 3 series · 14 of 47 positions shown, 17 images · non-contrast
Comparison: None.

CLINICAL DATA: Code stroke. Stroke, follow up Neuro deficit, acute,
stroke suspected



[Series 4: head 3.0 cor st · coronal · 0.34mm/px · 3 of 74 slices shown]
[im 25/74  brain]
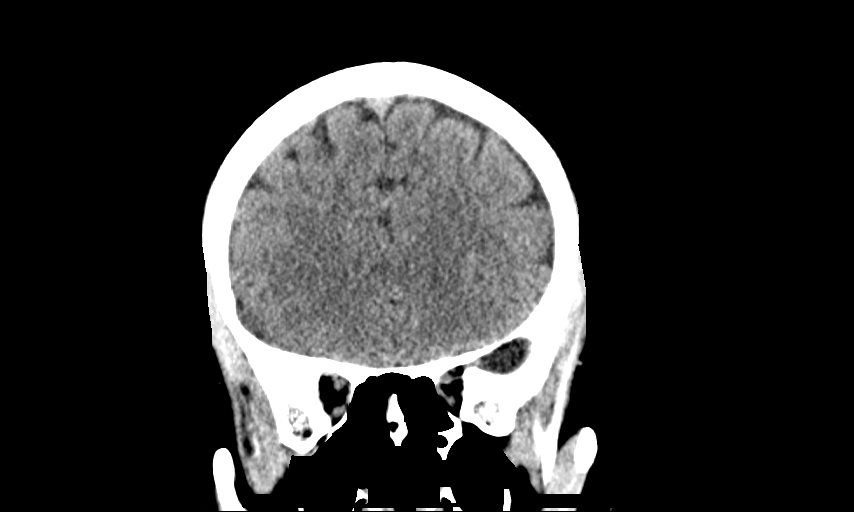
[im 33/74  brain]
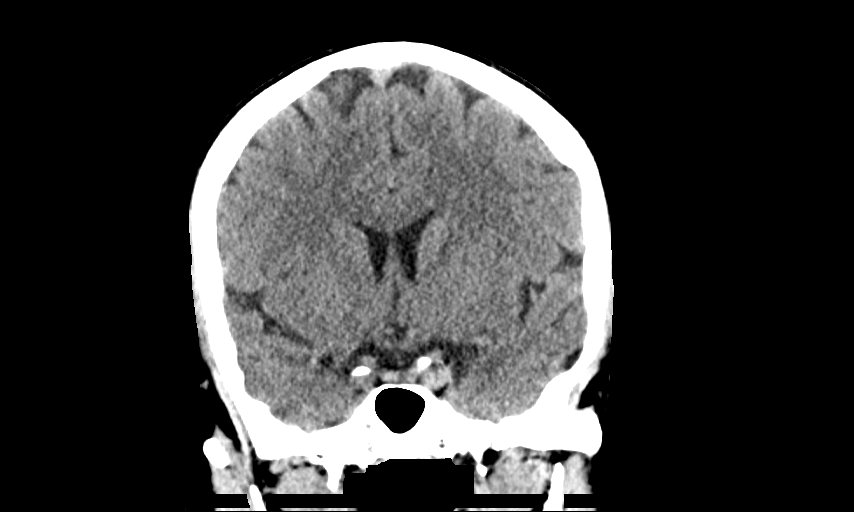
[im 41/74  brain]
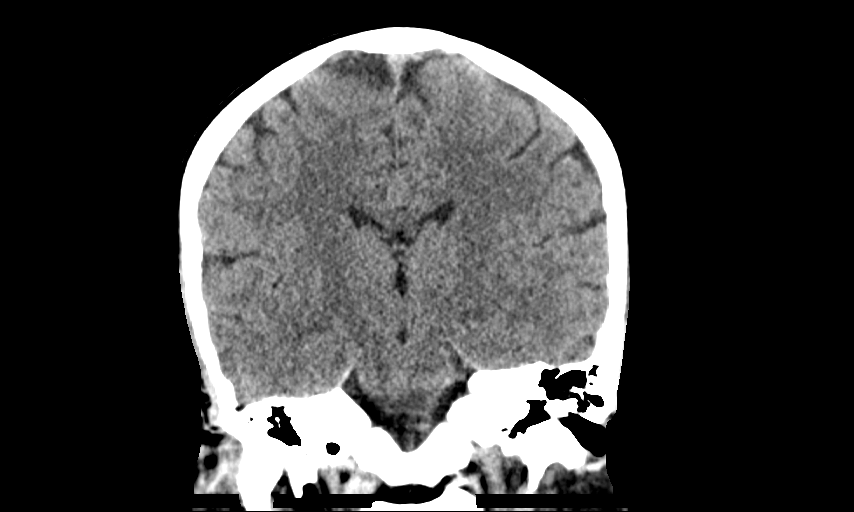

[Series 6: head 5.0 st · axial · 0.41mm/px · z∈[-82,+63]mm · 11 of 35 slices shown, 14 images]
[im 3/35  brain]
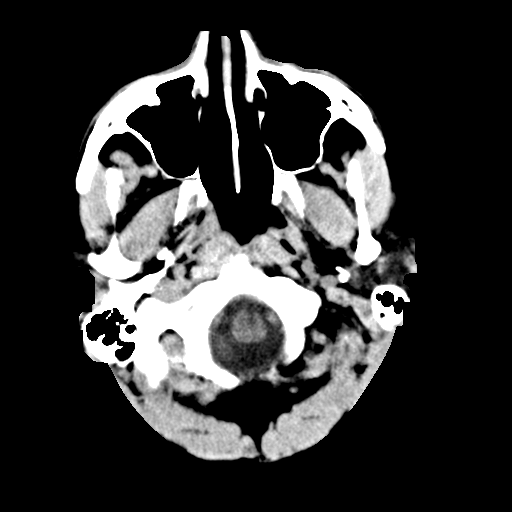
[im 3/35  bone]
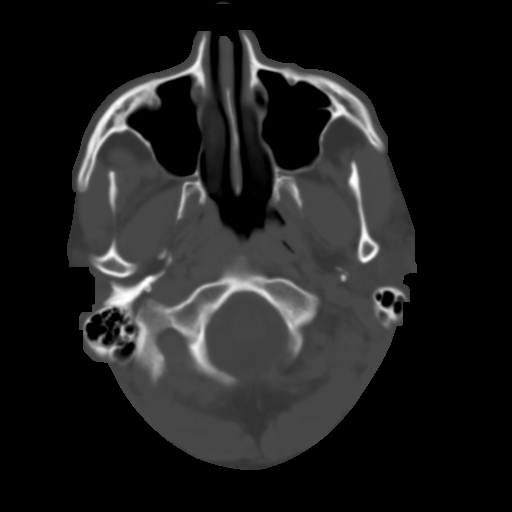
[im 5/35  brain]
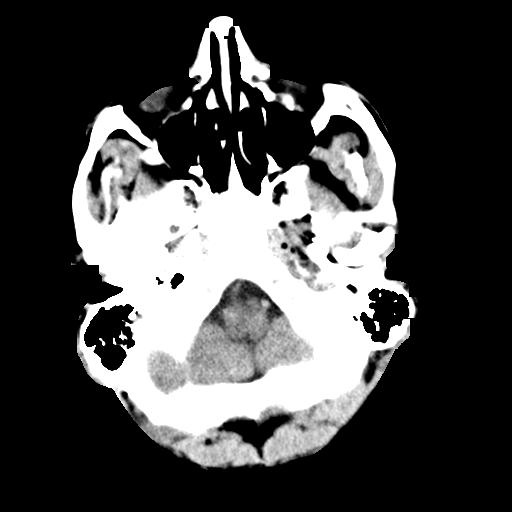
[im 9/35  brain]
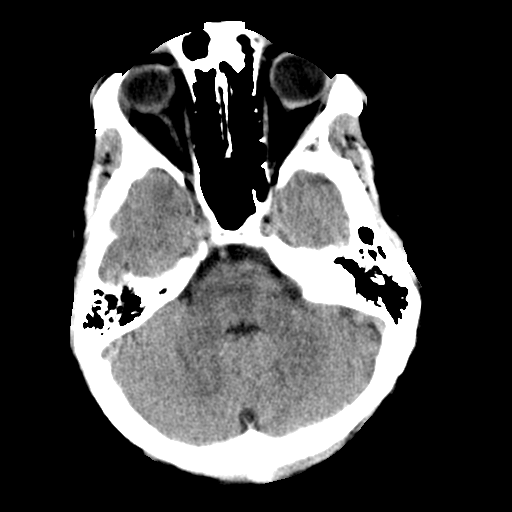
[im 11/35  brain]
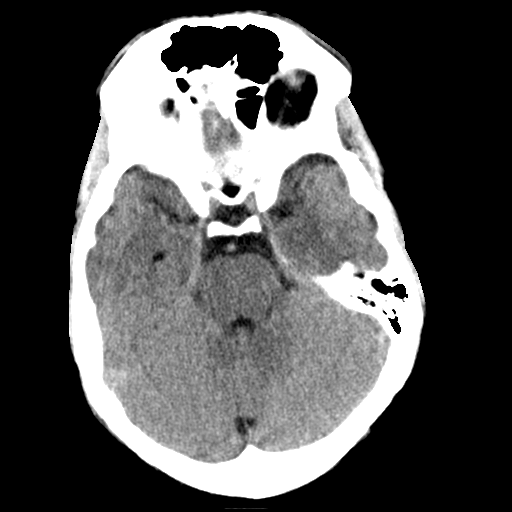
[im 15/35  brain]
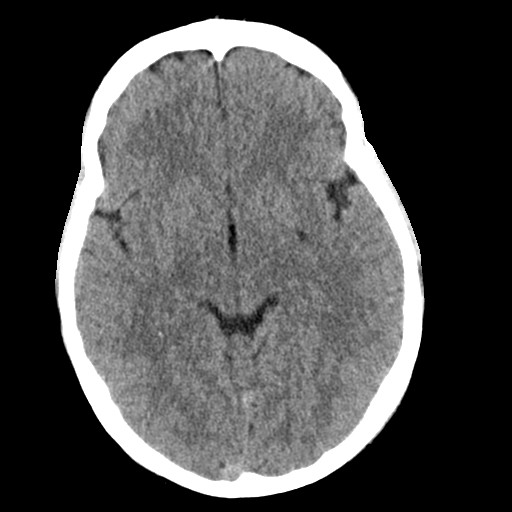
[im 15/35  bone]
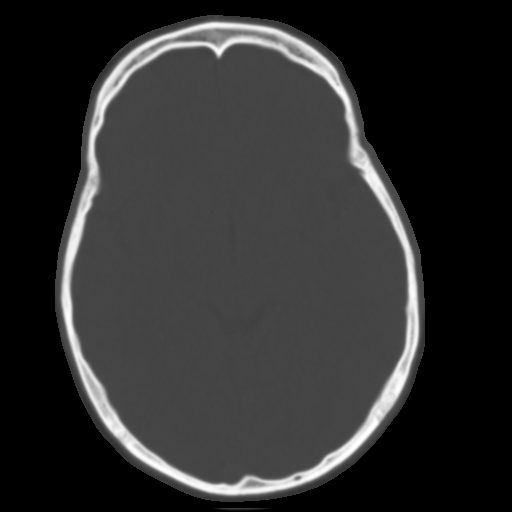
[im 18/35  brain]
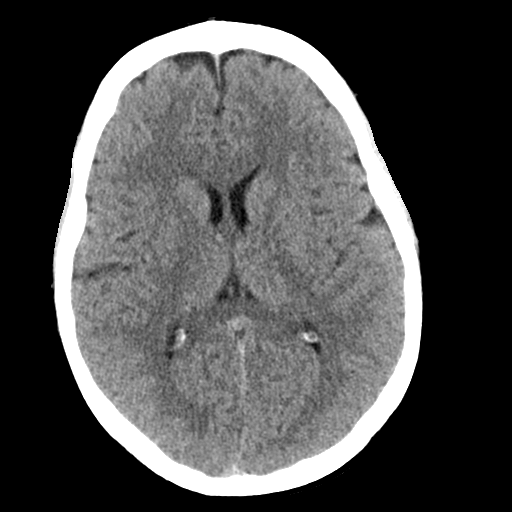
[im 20/35  brain]
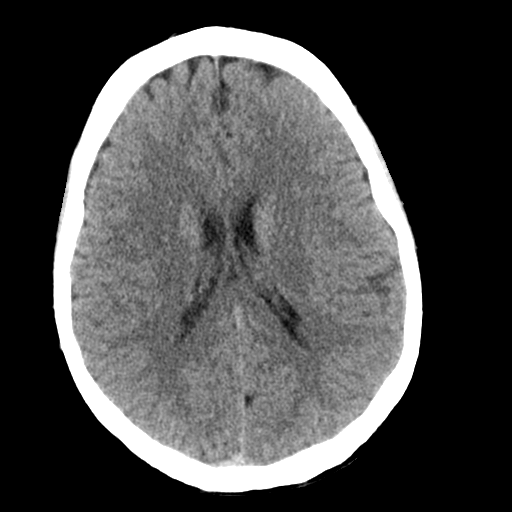
[im 24/35  brain]
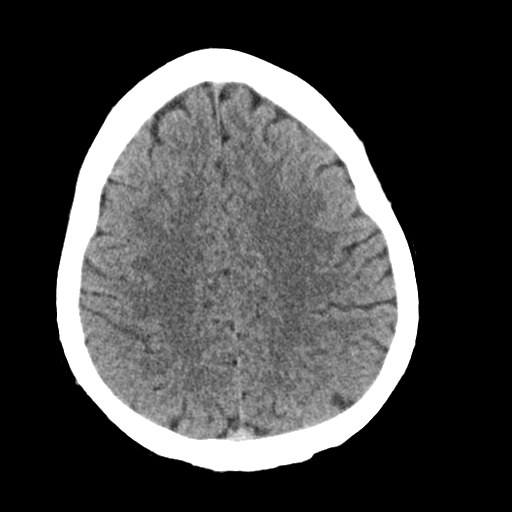
[im 26/35  brain]
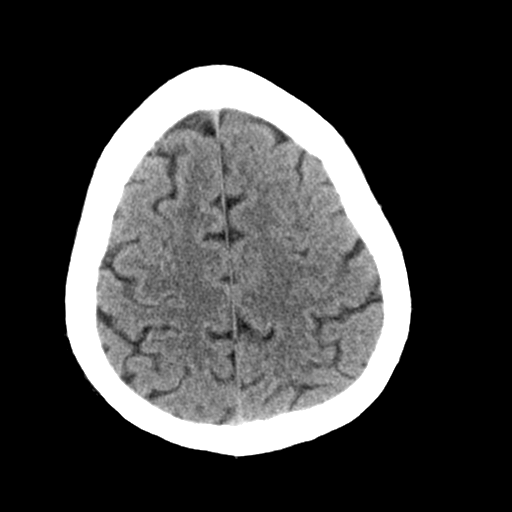
[im 26/35  bone]
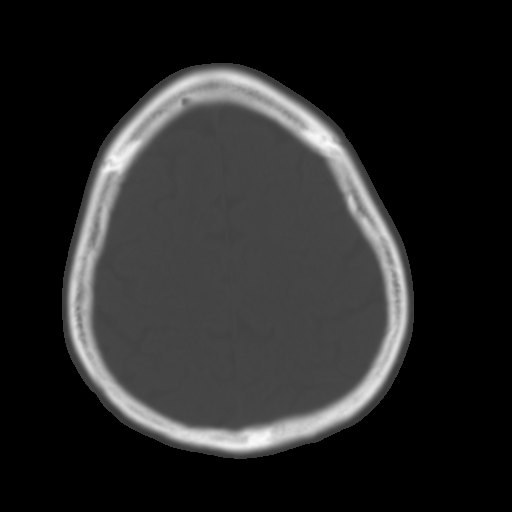
[im 30/35  brain]
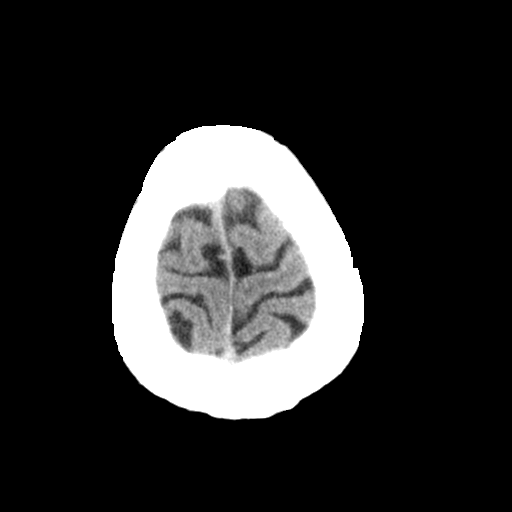
[im 32/35  brain]
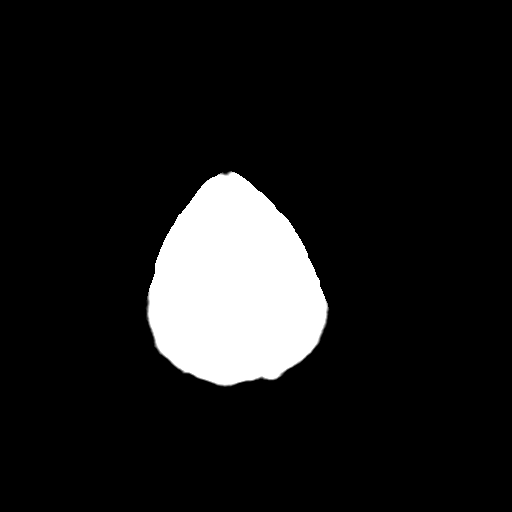

[14 of 47 positions shown; findings below may reference images not displayed]

FINDINGS: Brain: There is no acute intracranial hemorrhage, mass effect, or
edema. Gray-white differentiation is preserved. Ventricles and sulci
are normal in size and configuration. No extra-axial collection.

Vascular: No hyperdense vessel.

Skull: Unremarkable.

Sinuses/Orbits: Minor mucosal thickening.  Orbits are unremarkable.

Other: Mastoid air cells are clear.

ASPECTS (Alberta Stroke Program Early CT Score)

- Ganglionic level infarction (caudate, lentiform nuclei, internal
capsule, insula, M1-M3 cortex): 7

- Supraganglionic infarction (M4-M6 cortex): 3

Total score (0-10 with 10 being normal): 10
IMPRESSION: There is no acute intracranial hemorrhage or evidence of acute
infarction. ASPECT score is 10.

These results were communicated to Dr. SANGITA at [DATE] on [DATE] by
text page via the [employer] messaging system.

## 2021-01-17 MED ORDER — IOHEXOL 300 MG/ML  SOLN
83.0000 mL | Freq: Once | INTRAMUSCULAR | Status: AC | PRN
  Administered 2021-01-17: 83 mL via INTRAVENOUS

## 2021-01-17 NOTE — ED Provider Notes (Signed)
Care of the patient assumed at the change of shift pending MRI for evaluation of new stroke after recent CVA due to a PFO.  Physical Exam  BP 112/76    Pulse (!) 55    Temp 98 F (36.7 C) (Oral)    Resp 16    SpO2 100%   Physical Exam  Procedures  Procedures  ED Course / MDM    Medical Decision Making  MRI is negative. Patient seen by Neurology who have cleared him to go home. He has outpatient cardiology follow up for PFO repair.        Truddie Hidden, MD 01/17/21 [phone]

## 2021-01-17 NOTE — H&P (View-Only) (Signed)
Stroke Neurology Consultation Note  Consult Requested by: Dr. Regenia Skeeter  Reason for Consult: code stroke  Consult Date: 01/17/21   The history was obtained from the pt.  During history and examination, all items were able to obtain unless otherwise noted.  History of Present Illness:  Jimmy Kim is a 36 y.o. Caucasian male with PMH of recent stroke, HLD presented to ED for code stroke.  Per patient, 7:20 AM this morning he had some different feeling of right arm, however no drift or significant weakness on self test.  He went to work.  Around 12 PM he felt entire body different feeling, both arms and legs jello feeling, some tremulous and slight dizziness, lightheadedness.  He denies speech difficulty, loss of consciousness, weakness or numbness.  CT no acute abnormality.  Patient admitted on 01/04/2021 for small right frontal infarct, at that time he had right hand weakness and slurred speech for about 1 hour and resolved.  MRI confirmed right frontal small stroke, stroke work-up showed no DVT, EF 60 to 65%, however moderate sized PFO with bidirectional shunting.  Hypercoagulable work-up largely unremarkable.  A1c 5.3, LDL 146.  He was discharged on DAPT and Lipitor 40.  On 01/11/2021, he saw Dr. Bettina Gavia cardiology and scheduled for TEE on 02/08/2021 and referred to Dr. Burt Knack for PFO closure.  LSN: 12 PM tPA Given: No: Recent stroke, NIH score 0 mRS = 0 IR: no, NIH score 0  Past Medical History:  Diagnosis Date   Asthma    Stroke [facility])     Past Surgical History:  Procedure Laterality Date   MANDIBLE SURGERY      Family History  Problem Relation Age of Onset   Neurologic Disorder Mother    Healthy Father    Stroke Paternal Grandfather     Social History:  reports that he has never smoked. He has never been exposed to tobacco smoke. He has never used smokeless tobacco. He reports that he does not drink alcohol and does not use drugs.  Allergies:  Allergies  Allergen  Reactions   Other Other (See Comments)    Quinoa - intolerance    No current facility-administered medications on file prior to encounter.   Current Outpatient Medications on File Prior to Encounter  Medication Sig Dispense Refill   albuterol (VENTOLIN HFA) 108 (90 Base) MCG/ACT inhaler Inhale 2 puffs into the lungs every 6 (six) hours as needed for wheezing or shortness of breath.     aspirin 81 MG chewable tablet Chew 1 tablet (81 mg total) by mouth daily. 30 tablet 2   atorvastatin (LIPITOR) 40 MG tablet Take 1 tablet (40 mg total) by mouth daily. 30 tablet 2   clopidogrel (PLAVIX) 75 MG tablet Take 1 tablet (75 mg total) by mouth daily. 20 tablet 0   Melatonin 2.5 MG CHEW Chew 2.5 mg by mouth daily as needed (sleep).     mometasone (NASONEX) 50 MCG/ACT nasal spray Place 1 spray into the nose daily as needed (nasal congestion).     OVER THE COUNTER MEDICATION Place 2 drops into both ears daily as needed (for ear pain). Similasan Ear ache relief -Homeopathic medicine      Review of Systems: A full ROS was attempted today and was able to be performed.  Systems assessed include - Constitutional, Eyes, HENT, Respiratory, Cardiovascular, Gastrointestinal, Genitourinary, Integument/breast, Hematologic/lymphatic, Musculoskeletal, Neurological, Behavioral/Psych, Endocrine, Allergic/Immunologic - with pertinent responses as per HPI.  Physical Examination: Temp:  [98 F (36.7 C)] 98 F (  36.7 C) (01/12 1315) Pulse Rate:  [89] 89 (01/12 1315) Resp:  [16] 16 (01/12 1315) BP: (130)/(100) 130/100 (01/12 1315) SpO2:  [100 %] 100 % (01/12 1315)  General - well nourished, well developed, in no apparent distress, mildly anxious.    Ophthalmologic - fundi not visualized due to noncooperation.    Cardiovascular - regular rhythm and rate  Mental Status -  Level of arousal and orientation to time, place, and person were intact. Language including expression, naming, repetition, comprehension,  reading, and writing was assessed and found intact. Attention span and concentration were normal. Fund of Knowledge was assessed and was intact.  Cranial Nerves II - XII - II - Vision intact OU. III, IV, VI - Extraocular movements intact. V - Facial sensation intact bilaterally. VII - Facial movement intact bilaterally. VIII - Hearing & vestibular intact bilaterally. X - Palate elevates symmetrically. XI - Chin turning & shoulder shrug intact bilaterally. XII - Tongue protrusion intact.  Motor Strength - The patients strength was normal in all extremities and pronator drift was absent.   Motor Tone & Bulk - Muscle tone was assessed at the neck and appendages and was normal.  Bulk was normal and fasciculations were absent.   Reflexes - The patients reflexes were normal in all extremities and he had no pathological reflexes.  Sensory - Light touch, temperature/pinprick were assessed and were normal.    Coordination - The patient had normal movements in the hands and feet with no ataxia or dysmetria.  Tremor was absent.  Gait and Station - deferred  Data Reviewed: CT ANGIO HEAD NECK W WO CM  Result Date: 01/04/2021 CLINICAL DATA:  Spasms, slurred speech EXAM: CT ANGIOGRAPHY HEAD AND NECK TECHNIQUE: Multidetector CT imaging of the head and neck was performed using the standard protocol during bolus administration of intravenous contrast. Multiplanar CT image reconstructions and MIPs were obtained to evaluate the vascular anatomy. Carotid stenosis measurements (when applicable) are obtained utilizing NASCET criteria, using the distal internal carotid diameter as the denominator. CONTRAST:  39mL OMNIPAQUE IOHEXOL 350 MG/ML SOLN COMPARISON:  None. FINDINGS: CT HEAD FINDINGS Brain: There is no acute intracranial hemorrhage, extra-axial fluid collection, or acute infarct. Parenchymal volume is normal. The ventricles are normal in size. Gray-white differentiation is preserved. There is no mass  lesion.  There is no midline shift. Vascular: No hyperdense vessel or unexpected calcification. Skull: Normal. Negative for fracture or focal lesion. Sinuses: There is minimal mucosal thickening in the maxillary sinuses. Orbits: The globes and orbits are unremarkable. Review of the MIP images confirms the above findings CTA NECK FINDINGS Aortic arch: The aortic arch is normal. The origins of the major branch vessels are patent. Right carotid system: The right common, internal, and external carotid arteries are patent, without hemodynamically significant stenosis or occlusion. There is no dissection or aneurysm. Left carotid system: The left common, internal, and external carotid arteries are patent, without hemodynamically significant stenosis or occlusion. There is no dissection or aneurysm. Vertebral arteries: The vertebral arteries are codominant. The vertebral arteries are patent, without hemodynamically significant stenosis or occlusion. There is no dissection or aneurysm. Skeleton: There is no acute osseous abnormality or aggressive osseous lesion. Postsurgical changes are noted in the mandible bilaterally. Other neck: The soft tissues are unremarkable. Upper chest: The imaged lung apices are clear. Review of the MIP images confirms the above findings CTA HEAD FINDINGS Anterior circulation: The intracranial ICAs are patent. The bilateral MCAs are patent. The bilateral ACAs are patent. The  anterior communicating artery is patent. There is no aneurysm. Posterior circulation: The bilateral V4 segments are patent. PICA is patent bilaterally. The basilar artery is patent. The bilateral PCAs are patent. The posterior communicating arteries are not identified. There is no aneurysm. Venous sinuses: Patent. Anatomic variants: None. Review of the MIP images confirms the above findings IMPRESSION: 1. No acute intracranial pathology. 2. Normal CTA of the head and neck. Electronically Signed   By: Valetta Mole M.D.   On:  01/04/2021 12:15   MR Brain W and Wo Contrast  Result Date: 01/04/2021 CLINICAL DATA:  Neuro deficit, acute, stroke suspected EXAM: MRI HEAD WITHOUT AND WITH CONTRAST TECHNIQUE: Multiplanar, multiecho pulse sequences of the brain and surrounding structures were obtained without and with intravenous contrast. CONTRAST:  22mL GADAVIST GADOBUTROL 1 MMOL/ML IV SOLN COMPARISON:  None. FINDINGS: Brain: Small area of reduced diffusion in the left frontal lobe involving the left precentral gyrus this lateral to hand motor region and extending into the centrum semiovale. No evidence of intracranial hemorrhage. There is no intracranial mass, mass effect, or edema. There is no hydrocephalus or extra-axial fluid collection. Ventricles and sulci are normal in size and configuration. No abnormal enhancement. Vascular: Major vessel flow voids at the skull base are preserved. Skull and upper cervical spine: Normal marrow signal is preserved. Sinuses/Orbits: Minor mucosal thickening.  Orbits are unremarkable. Other: Sella is unremarkable.  Mastoid air cells are clear. 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 Electronically Signed   By: Macy Mis M.D.   On: 01/04/2021 15:11   EEG adult  Result Date: 01/06/2021 Lora Havens, MD     01/06/2021  7:13 AM Patient Name: Jimmy Kim MRN: [id number] Epilepsy Attending: Lora Havens Referring Physician/Provider: Janine Ores, NP Date: 01/05/2021 Duration: 28.27 mins Patient history: 36 y.o. male with history of well-controlled asthma presenting with had uncontrollable, involuntary writhing of his right arm and hand followed by slurred speech which began around 0930 this morning which lasted ~1 minute. He had a somewhat similar episode in character and duration without slurred speech ~about a month ago. EEG to evaluate for seizure Level of alertness: Awake, asleep AEDs during EEG study: None Technical aspects: This EEG study was done with scalp  electrodes positioned according to the 10-20 International system of electrode placement. Electrical activity was acquired at a sampling rate of 500Hz  and reviewed with a high frequency filter of 70Hz  and a low frequency filter of 1Hz . EEG data were recorded continuously and digitally stored. Description: The posterior dominant rhythm consists of 10 Hz activity of moderate voltage (25-35 uV) seen predominantly in posterior head regions, symmetric and reactive to eye opening and eye closing. Sleep was characterized by vertex waves, sleep spindles (12 to 14 Hz), maximal frontocentral region. Physiologic photic driving was seen during photic stimulation.  Hyperventilation was not performed.   IMPRESSION: This study is within normal limits. No seizures or epileptiform discharges were seen throughout the recording. Lora Havens   ECHOCARDIOGRAM COMPLETE BUBBLE STUDY  Result Date: 01/05/2021    ECHOCARDIOGRAM REPORT   Patient Name:   LORING COCKBURN Date of Exam: 01/05/2021 Medical Rec #:  [id number]       Height:       72.0 in Accession #:    [id number]      Weight:       175.0 lb Date of Birth:  [birth year]        BSA:  2.013 m Patient Age:    35 years        BP:           111/78 mmHg Patient Gender: M               HR:           72 bpm. Exam Location:  Inpatient Procedure: 2D Echo, Cardiac Doppler, Color Doppler and Saline Contrast Bubble            Study Indications:   Stroke  History:       Patient has no prior history of Echocardiogram examinations.                Asthma.  Sonographer:   Roosvelt Maserachel Lane RDCS Referring      [id number] [facility] Group[facility]ISHNA[facility] Phys: IMPRESSIONS  1. Left ventricular ejection fraction, by estimation, is 60 to 65%. The left ventricle has normal function. The left ventricle has no regional wall motion abnormalities. Left ventricular diastolic parameters were normal.  2. Right ventricular systolic function is normal. The right ventricular size is normal.  3. The mitral valve is grossly  normal. No evidence of mitral valve regurgitation.  4. The aortic valve is tricuspid. Aortic valve regurgitation is not visualized.  5. The inferior vena cava is normal in size with greater than 50% respiratory variability, suggesting right atrial pressure of 3 mmHg.  6. Evidence of atrial level shunting detected by color flow Doppler. Agitated saline contrast bubble study was positive with shunting observed within 3-6 cardiac cycles suggestive of interatrial shunt. There is a moderately sized patent foramen ovale with bidirectional shunting across atrial septum. Comparison(s): No prior Echocardiogram. Conclusion(s)/Recommendation(s): Findings concerning for PFO, would recommend Transesophageal Echocardiogram for clarification and evaluation for possible device closure. FINDINGS  Left Ventricle: Left ventricular ejection fraction, by estimation, is 60 to 65%. The left ventricle has normal function. The left ventricle has no regional wall motion abnormalities. The left ventricular internal cavity size was normal in size. There is  no left ventricular hypertrophy. Left ventricular diastolic parameters were normal. Right Ventricle: The right ventricular size is normal. No increase in right ventricular wall thickness. Right ventricular systolic function is normal. Left Atrium: Left atrial size was normal in size. Right Atrium: Right atrial size was normal in size. Pericardium: There is no evidence of pericardial effusion. Mitral Valve: The mitral valve is grossly normal. No evidence of mitral valve regurgitation. Tricuspid Valve: The tricuspid valve is grossly normal. Tricuspid valve regurgitation is not demonstrated. Aortic Valve: The aortic valve is tricuspid. Aortic valve regurgitation is not visualized. Aortic valve mean gradient measures 3.0 mmHg. Aortic valve peak gradient measures 5.8 mmHg. Aortic valve area, by VTI measures 3.63 cm. Pulmonic Valve: The pulmonic valve was normal in structure. Pulmonic valve  regurgitation is trivial. Aorta: The aortic root and ascending aorta are structurally normal, with no evidence of dilitation. Venous: The inferior vena cava is normal in size with greater than 50% respiratory variability, suggesting right atrial pressure of 3 mmHg. IAS/Shunts: The interatrial septum is aneurysmal. Evidence of atrial level shunting detected by color flow Doppler. Agitated saline contrast was given intravenously to evaluate for intracardiac shunting. Agitated saline contrast bubble study was positive  with shunting observed within 3-6 cardiac cycles suggestive of interatrial shunt. A moderately sized patent foramen ovale is detected with bidirectional shunting across atrial septum.  LEFT VENTRICLE PLAX 2D LVIDd:         4.70 cm   Diastology LVIDs:  3.10 cm   LV e' medial:    12.30 cm/s LV PW:         0.80 cm   LV E/e' medial:  6.0 LV IVS:        0.80 cm   LV e' lateral:   18.10 cm/s LVOT diam:     2.30 cm   LV E/e' lateral: 4.1 LV SV:         81 LV SV Index:   40 LVOT Area:     4.15 cm  RIGHT VENTRICLE RV Basal diam:  3.40 cm LEFT ATRIUM             Index        RIGHT ATRIUM           Index LA diam:        2.80 cm 1.39 cm/m   RA Area:     13.50 cm LA Vol (A2C):   33.9 ml 16.84 ml/m  RA Volume:   34.90 ml  17.34 ml/m LA Vol (A4C):   24.2 ml 12.02 ml/m LA Biplane Vol: 31.3 ml 15.55 ml/m  AORTIC VALVE AV Area (Vmax):    3.50 cm AV Area (Vmean):   3.21 cm AV Area (VTI):     3.63 cm AV Vmax:           120.00 cm/s AV Vmean:          82.000 cm/s AV VTI:            0.222 m AV Peak Grad:      5.8 mmHg AV Mean Grad:      3.0 mmHg LVOT Vmax:         101.00 cm/s LVOT Vmean:        63.400 cm/s LVOT VTI:          0.194 m LVOT/AV VTI ratio: 0.87  AORTA Ao Root diam: 3.70 cm Ao Asc diam:  2.80 cm MITRAL VALVE MV Area (PHT): 3.42 cm    SHUNTS MV Decel Time: 222 msec    Systemic VTI:  0.19 m MV E velocity: 73.90 cm/s  Systemic Diam: 2.30 cm MV A velocity: 48.20 cm/s MV E/A ratio:  1.53 Lyman Bishop  MD Electronically signed by Lyman Bishop MD Signature Date/Time: 01/05/2021/4:55:43 PM    Final    CT HEAD CODE STROKE WO CONTRAST  Result Date: 01/17/2021 CLINICAL DATA:  Code stroke. Stroke, follow up Neuro deficit, acute, stroke suspected EXAM: CT HEAD WITHOUT CONTRAST TECHNIQUE: Contiguous axial images were obtained from the base of the skull through the vertex without intravenous contrast. RADIATION DOSE REDUCTION: This exam was performed according to the departmental dose-optimization program which includes automated exposure control, adjustment of the mA and/or kV according to patient size and/or use of iterative reconstruction technique. COMPARISON:  None. FINDINGS: Brain: There is no acute intracranial hemorrhage, mass effect, or edema. Gray-white differentiation is preserved. Ventricles and sulci are normal in size and configuration. No extra-axial collection. Vascular: No hyperdense vessel. Skull: Unremarkable. Sinuses/Orbits: Minor mucosal thickening.  Orbits are unremarkable. Other: Mastoid air cells are clear. ASPECTS ([place] Stroke Program Early CT Score) - Ganglionic level infarction (caudate, lentiform nuclei, internal capsule, insula, M1-M3 cortex): 7 - Supraganglionic infarction (M4-M6 cortex): 3 Total score (0-10 with 10 being normal): 10 IMPRESSION: There is no acute intracranial hemorrhage or evidence of acute infarction. ASPECT score is 10. These results were communicated to Dr. Erlinda Hong at 1:44 pm on 01/17/2021 by text page via the [facility] messaging system.  Electronically Signed   By: Macy Mis M.D.   On: 01/17/2021 13:47   VAS [place] LOWER EXTREMITY VENOUS (DVT)  Result Date: 01/05/2021  Lower Venous DVT Study Patient Name:  Jimmy Kim  Date of Exam:   01/05/2021 Medical Rec #: [id number]        Accession #:    [id number] Date of Birth: [birth year]         Patient Gender: M Patient Age:   21 years Exam Location:  [facility] Procedure:      VAS [place] LOWER EXTREMITY VENOUS (DVT)  Referring Phys: Gevena Barre --------------------------------------------------------------------------------  Indications: Stroke.  Comparison Study: no prior Performing Technologist: Archie Patten RVS  Examination Guidelines: A complete evaluation includes B-mode imaging, spectral Doppler, color Doppler, and power Doppler as needed of all accessible portions of each vessel. Bilateral testing is considered an integral part of a complete examination. Limited examinations for reoccurring indications may be performed as noted. The reflux portion of the exam is performed with the patient in reverse Trendelenburg.  +---------+---------------+---------+-----------+----------+--------------+  RIGHT     Compressibility Phasicity Spontaneity Properties Thrombus Aging  +---------+---------------+---------+-----------+----------+--------------+  CFV       Full            Yes       Yes                                    +---------+---------------+---------+-----------+----------+--------------+  SFJ       Full                                                             +---------+---------------+---------+-----------+----------+--------------+  FV Prox   Full                                                             +---------+---------------+---------+-----------+----------+--------------+  FV Mid    Full                                                             +---------+---------------+---------+-----------+----------+--------------+  FV Distal Full                                                             +---------+---------------+---------+-----------+----------+--------------+  PFV       Full                                                             +---------+---------------+---------+-----------+----------+--------------+  POP       Full            Yes       Yes                                    +---------+---------------+---------+-----------+----------+--------------+  PTV       Full                                                              +---------+---------------+---------+-----------+----------+--------------+  PERO      Full                                                             +---------+---------------+---------+-----------+----------+--------------+   +---------+---------------+---------+-----------+----------+--------------+  LEFT      Compressibility Phasicity Spontaneity Properties Thrombus Aging  +---------+---------------+---------+-----------+----------+--------------+  CFV       Full            Yes       Yes                                    +---------+---------------+---------+-----------+----------+--------------+  SFJ       Full                                                             +---------+---------------+---------+-----------+----------+--------------+  FV Prox   Full                                                             +---------+---------------+---------+-----------+----------+--------------+  FV Mid    Full                                                             +---------+---------------+---------+-----------+----------+--------------+  FV Distal Full                                                             +---------+---------------+---------+-----------+----------+--------------+  PFV       Full                                                             +---------+---------------+---------+-----------+----------+--------------+  POP       Full            Yes       Yes                                    +---------+---------------+---------+-----------+----------+--------------+  PTV       Full                                                             +---------+---------------+---------+-----------+----------+--------------+  PERO      Full                                                             +---------+---------------+---------+-----------+----------+--------------+     Summary: BILATERAL: - No evidence of deep vein thrombosis seen in the lower  extremities, bilaterally. -No evidence of popliteal cyst, bilaterally.   *See table(s) above for measurements and observations. Electronically signed by Monica Martinez MD on 01/05/2021 at 11:35:47 AM.    Final     Assessment: 36 y.o. male with PMH of recent stroke, HLD presented to ED for strange feeling of right arm at 7:20 AM this morning, and entire body different feeling, both arms and legs jello feeling, mild while body tremulous and slight dizziness, lightheadedness at 12pm.  NIHSS = 0. CT no acute abnormality.  Patient recently admitted on 01/04/2021 for small right frontal infarct, no DVT, EF 60 to 65%, however moderate sized PFO with bidirectional shunting.  Hypercoagulable work-up largely unremarkable.  A1c 5.3, LDL 146.  He was discharged on DAPT and Lipitor 40. On 01/11/2021, he saw Dr. Bettina Gavia cardiology and scheduled for TEE on 02/08/2021 and referred to Dr. Burt Knack for PFO closure.  Patient current symptoms more likely anxiety and panic attack in the setting of recent stroke, less likely new stroke on exam.  However, would like to have MRI to confirm.    Stroke Risk Factors -  PFO, HLD  Plan: MRI brain  Neuro check and vital signs every 2 hours If no stroke, he may be discharged from ER with scheduled outpatient TEE and PFO closure.  However, if recurrent stroke on MRI, he needs to be admitted and will have more urgent TEE and PFO closure procedure. Continue home aspirin and Plavix and statin Discussed with Dr. Regenia Skeeter ED physician We will follow   Thank you for this consultation and allowing [place] to participate in the care of this patient.   Rosalin Hawking, MD PhD Stroke Neurology 01/17/2021 2:21 PM   ADDENDUM:  MRI showed no acute infarct. No further work up needed at this time. He has TEE scheduled at 02/08/21 and will see Dr. Krista Blue at [facility] 02/22/21 and Dr. Burt Knack cardiology 03/01/21. Will continue ASA and plavix and statin for now. Discussed with pt and updated about his condition and  upcoming appointments. He expressed understanding. He can be discharged from neuro standpoint.   Rosalin Hawking, MD PhD Stroke Neurology 01/17/2021 4:22 PM

## 2021-01-17 NOTE — ED Triage Notes (Signed)
Pt arrived POV c/o dizziness and weakness this morning. Pt has a hx of stroke. Pt's LKW was 7am. Pt states he had a stroke about 2 weeks ago and started having some of the same symptoms. Pt states he was dizzy, noticed some arm weakness and loss of coordination.

## 2021-01-17 NOTE — Code Documentation (Signed)
Stroke Response Nurse Documentation Code Documentation  Jimmy Kim is a 36 y.o. male arriving to [facility] ED via Private Vehicle on 01/17/21 with past medical hx of PFO, CVA. On aspirin 81 mg daily and clopidogrel 75 mg daily. Code stroke was activated by ED triage RN.  Patient from home/work where he was LKW at 1200 and now complaining of his hands and legs feeling "different" with some numbness in his right hand. Patient states he had a similar episode this morning around 0700 but it quickly resolved. Patient also discloses he had a recent CVA and symptoms are similar.   Stroke team at the bedside when patient arrived to CT from triage. Patient to CT with team. NIHSS 1, see documentation for details and code stroke times. Patient with bilateral decreased sensation on exam. The following imaging was completed: CT. Patient is not a candidate for IV Thrombolytic due to low NIHSS. Patient is not a candidate for IR due to no suspected LVO.  Care/Plan: routine follow up MRI and Q2 NIHSS and vital signs.   Bedside handoff with ED RN.    Meda Klinefelter  Stroke Response RN

## 2021-01-17 NOTE — ED Notes (Signed)
[facility] called to activate code stroke per [employer] request, spoke with [place].

## 2021-01-17 NOTE — ED Provider Notes (Signed)
[facility] EMERGENCY DEPARTMENT Provider Note   CSN: [id number] Arrival date & time: 01/17/21  1308     History  Chief Complaint  Patient presents with   Weakness   Dizziness    Jimmy Kim is a 36 y.o. male.  HPI 36 year old male presents with acute weakness.  History is from the patient.  He woke up feeling okay but around 730 he was in his car and was try to plug in his phone and noticed his right arm was not acting quite right.  This was brief and less than a minute.  He was concerned because that was somewhat like his symptoms from his stroke a few weeks ago.  He was going into work at around 12:30 PM and he developed a mild to moderate headache.  Then he developed a diffuse feeling of weakness/gelling this.  Felt like all of his legs and arms were weak.  He has had some numbness/different sensation to the ulnar side of his right pinky going a little proximal to his wrist.  Otherwise everything feels diffusely weak.  He felt a little dizzy.  Called family member who is in the medical field and was told to come back to the ER.  Headache is currently gone. Called a code stroke in triage.     Home Medications Prior to Admission medications   Medication Sig Start Date End Date Taking? Authorizing Provider  albuterol (VENTOLIN HFA) 108 (90 Base) MCG/ACT inhaler Inhale 2 puffs into the lungs every 6 (six) hours as needed for wheezing or shortness of breath.    [provider]  aspirin 81 MG chewable tablet Chew 1 tablet (81 mg total) by mouth daily. 01/07/21   Hollice Espy, MD  atorvastatin (LIPITOR) 40 MG tablet Take 1 tablet (40 mg total) by mouth daily. 01/07/21   Hollice Espy, MD  clopidogrel (PLAVIX) 75 MG tablet Take 1 tablet (75 mg total) by mouth daily. 01/07/21   Hollice Espy, MD  Melatonin 2.5 MG CHEW Chew 2.5 mg by mouth daily as needed (sleep).    [provider]  mometasone (NASONEX) 50 MCG/ACT nasal spray Place 1 spray  into the nose daily as needed (nasal congestion).    [provider]  OVER THE COUNTER MEDICATION Place 2 drops into both ears daily as needed (for ear pain). Similasan Ear ache relief -Homeopathic medicine    [provider]      Allergies    Other    Review of Systems   Review of Systems  Constitutional:  Negative for fever.  Gastrointestinal:  Negative for diarrhea and vomiting.  Neurological:  Positive for weakness, numbness and headaches.   Physical Exam Updated Vital Signs BP (!) 130/100 (BP Location: Left Arm)    Pulse 89    Temp 98 F (36.7 C) (Oral)    Resp 16    SpO2 100%  Physical Exam Vitals and nursing note reviewed.  Constitutional:      General: He is not in acute distress.    Appearance: He is well-developed. He is not ill-appearing or diaphoretic.  HENT:     Head: Normocephalic and atraumatic.  Eyes:     Extraocular Movements: Extraocular movements intact.  Cardiovascular:     Rate and Rhythm: Normal rate and regular rhythm.     Heart sounds: Normal heart sounds.  Pulmonary:     Effort: Pulmonary effort is normal.  Abdominal:     General: There is no  distension.  Skin:    General: Skin is warm and dry.  Neurological:     Mental Status: He is alert.     Comments: CN 3-12 grossly intact. 5/5 strength in all 4 extremities. Grossly normal sensation save for a decrease sensation to the ulnar aspect of his right pinky finger, ulnar hand. Normal finger to nose.     ED Results / Procedures / Treatments   Labs (all labs ordered are listed, but only abnormal results are displayed) Labs Reviewed  CBG MONITORING, ED - Abnormal; Notable for the following components:      Result Value   Glucose-Capillary 140 (*)    All other components within normal limits  I-STAT CHEM 8, ED - Abnormal; Notable for the following components:   BUN 24 (*)    Glucose, Bld 126 (*)    Calcium, Ion 1.08 (*)    All other components within normal limits  RESP PANEL BY  RT-PCR (FLU A&B, COVID) ARPGX2  PROTIME-INR  APTT  CBC  DIFFERENTIAL  ETHANOL  COMPREHENSIVE METABOLIC PANEL  RAPID URINE DRUG SCREEN, HOSP PERFORMED  URINALYSIS, ROUTINE W REFLEX MICROSCOPIC    EKG EKG Interpretation  Date/Time:  Thursday January 17 2021 13:59:26 EST Ventricular Rate:  60 PR Interval:  180 QRS Duration: 101 QT Interval:  401 QTC Calculation: 401 R Axis:   -2 Text Interpretation: Sinus rhythm RSR' in V1 or V2, right VCD or RVH Confirmed by Pricilla Loveless [phone]) on 01/17/2021 4:06:29 PM  Radiology CT HEAD CODE STROKE WO CONTRAST  Result Date: 01/17/2021 CLINICAL DATA:  Code stroke. Stroke, follow up Neuro deficit, acute, stroke suspected EXAM: CT HEAD WITHOUT CONTRAST TECHNIQUE: Contiguous axial images were obtained from the base of the skull through the vertex without intravenous contrast. RADIATION DOSE REDUCTION: This exam was performed according to the departmental dose-optimization program which includes automated exposure control, adjustment of the mA and/or kV according to patient size and/or use of iterative reconstruction technique. COMPARISON:  None. FINDINGS: Brain: There is no acute intracranial hemorrhage, mass effect, or edema. Gray-white differentiation is preserved. Ventricles and sulci are normal in size and configuration. No extra-axial collection. Vascular: No hyperdense vessel. Skull: Unremarkable. Sinuses/Orbits: Minor mucosal thickening.  Orbits are unremarkable. Other: Mastoid air cells are clear. ASPECTS ([place] Stroke Program Early CT Score) - Ganglionic level infarction (caudate, lentiform nuclei, internal capsule, insula, M1-M3 cortex): 7 - Supraganglionic infarction (M4-M6 cortex): 3 Total score (0-10 with 10 being normal): 10 IMPRESSION: There is no acute intracranial hemorrhage or evidence of acute infarction. ASPECT score is 10. These results were communicated to Dr. Roda Shutters at 1:44 pm on 01/17/2021 by text page via the [facility] messaging system.  Electronically Signed   By: Guadlupe Spanish M.D.   On: 01/17/2021 13:47    Procedures Procedures    Medications Ordered in ED Medications  iohexol (OMNIPAQUE) 300 MG/ML solution 83 mL (83 mLs Intravenous Contrast Given 01/17/21 1327)    ED Course/ Medical Decision Making/ A&P                           Medical Decision Making  Patient presents with acute symptoms concerning for possible stroke. He was made a code stroke in triage. The only focal findings I see is the numbness in right ulnar hand.  Based on his recent stroke, neurology has decided to get emergent MRI after negative CT head.  I have reviewed and interpreted his labs which showed no acute  abnormalities besides a mild LFT elevation of unclear cause.  His most recent hospital admission was reviewed and it seems that he had a PFO and is due for TEE next month.  He had a MRI positive for frontal lobe infarct last time.  He is not a tPA candidate at this time.  At this point, care was transferred to Dr. Bernette MayersSheldon with MRI and ultimate disposition pending.        Final Clinical Impression(s) / ED Diagnoses Final diagnoses:  None    Rx / DC Orders ED Discharge Orders     None         Pricilla LovelessGoldston, Pearlie Nies, MD 01/17/21 [phone]

## 2021-01-17 NOTE — Consult Note (Addendum)
Stroke Neurology Consultation Note  Consult Requested by: Dr. Regenia Skeeter  Reason for Consult: code stroke  Consult Date: 01/17/21   The history was obtained from the pt.  During history and examination, all items were able to obtain unless otherwise noted.  History of Present Illness:  Jimmy Kim is a 36 y.o. Caucasian male with PMH of recent stroke, HLD presented to ED for code stroke.  Per patient, 7:20 AM this morning he had some different feeling of right arm, however no drift or significant weakness on self test.  He went to work.  Around 12 PM he felt entire body different feeling, both arms and legs jello feeling, some tremulous and slight dizziness, lightheadedness.  He denies speech difficulty, loss of consciousness, weakness or numbness.  CT no acute abnormality.  Patient admitted on 01/04/2021 for small right frontal infarct, at that time he had right hand weakness and slurred speech for about 1 hour and resolved.  MRI confirmed right frontal small stroke, stroke work-up showed no DVT, EF 60 to 65%, however moderate sized PFO with bidirectional shunting.  Hypercoagulable work-up largely unremarkable.  A1c 5.3, LDL 146.  He was discharged on DAPT and Lipitor 40.  On 01/11/2021, he saw Dr. Bettina Gavia cardiology and scheduled for TEE on 02/08/2021 and referred to Dr. Burt Knack for PFO closure.  LSN: 12 PM tPA Given: No: Recent stroke, NIH score 0 mRS = 0 IR: no, NIH score 0  Past Medical History:  Diagnosis Date   Asthma    Stroke [facility])     Past Surgical History:  Procedure Laterality Date   MANDIBLE SURGERY      Family History  Problem Relation Age of Onset   Neurologic Disorder Mother    Healthy Father    Stroke Paternal Grandfather     Social History:  reports that he has never smoked. He has never been exposed to tobacco smoke. He has never used smokeless tobacco. He reports that he does not drink alcohol and does not use drugs.  Allergies:  Allergies  Allergen  Reactions   Other Other (See Comments)    Quinoa - intolerance    No current facility-administered medications on file prior to encounter.   Current Outpatient Medications on File Prior to Encounter  Medication Sig Dispense Refill   albuterol (VENTOLIN HFA) 108 (90 Base) MCG/ACT inhaler Inhale 2 puffs into the lungs every 6 (six) hours as needed for wheezing or shortness of breath.     aspirin 81 MG chewable tablet Chew 1 tablet (81 mg total) by mouth daily. 30 tablet 2   atorvastatin (LIPITOR) 40 MG tablet Take 1 tablet (40 mg total) by mouth daily. 30 tablet 2   clopidogrel (PLAVIX) 75 MG tablet Take 1 tablet (75 mg total) by mouth daily. 20 tablet 0   Melatonin 2.5 MG CHEW Chew 2.5 mg by mouth daily as needed (sleep).     mometasone (NASONEX) 50 MCG/ACT nasal spray Place 1 spray into the nose daily as needed (nasal congestion).     OVER THE COUNTER MEDICATION Place 2 drops into both ears daily as needed (for ear pain). Similasan Ear ache relief -Homeopathic medicine      Review of Systems: A full ROS was attempted today and was able to be performed.  Systems assessed include - Constitutional, Eyes, HENT, Respiratory, Cardiovascular, Gastrointestinal, Genitourinary, Integument/breast, Hematologic/lymphatic, Musculoskeletal, Neurological, Behavioral/Psych, Endocrine, Allergic/Immunologic - with pertinent responses as per HPI.  Physical Examination: Temp:  [98 F (36.7 C)] 98 F (  36.7 C) (01/12 1315) Pulse Rate:  [89] 89 (01/12 1315) Resp:  [16] 16 (01/12 1315) BP: (130)/(100) 130/100 (01/12 1315) SpO2:  [100 %] 100 % (01/12 1315)  General - well nourished, well developed, in no apparent distress, mildly anxious.    Ophthalmologic - fundi not visualized due to noncooperation.    Cardiovascular - regular rhythm and rate  Mental Status -  Level of arousal and orientation to time, place, and person were intact. Language including expression, naming, repetition, comprehension,  reading, and writing was assessed and found intact. Attention span and concentration were normal. Fund of Knowledge was assessed and was intact.  Cranial Nerves II - XII - II - Vision intact OU. III, IV, VI - Extraocular movements intact. V - Facial sensation intact bilaterally. VII - Facial movement intact bilaterally. VIII - Hearing & vestibular intact bilaterally. X - Palate elevates symmetrically. XI - Chin turning & shoulder shrug intact bilaterally. XII - Tongue protrusion intact.  Motor Strength - The patients strength was normal in all extremities and pronator drift was absent.   Motor Tone & Bulk - Muscle tone was assessed at the neck and appendages and was normal.  Bulk was normal and fasciculations were absent.   Reflexes - The patients reflexes were normal in all extremities and he had no pathological reflexes.  Sensory - Light touch, temperature/pinprick were assessed and were normal.    Coordination - The patient had normal movements in the hands and feet with no ataxia or dysmetria.  Tremor was absent.  Gait and Station - deferred  Data Reviewed: CT ANGIO HEAD NECK W WO CM  Result Date: 01/04/2021 CLINICAL DATA:  Spasms, slurred speech EXAM: CT ANGIOGRAPHY HEAD AND NECK TECHNIQUE: Multidetector CT imaging of the head and neck was performed using the standard protocol during bolus administration of intravenous contrast. Multiplanar CT image reconstructions and MIPs were obtained to evaluate the vascular anatomy. Carotid stenosis measurements (when applicable) are obtained utilizing NASCET criteria, using the distal internal carotid diameter as the denominator. CONTRAST:  20mL OMNIPAQUE IOHEXOL 350 MG/ML SOLN COMPARISON:  None. FINDINGS: CT HEAD FINDINGS Brain: There is no acute intracranial hemorrhage, extra-axial fluid collection, or acute infarct. Parenchymal volume is normal. The ventricles are normal in size. Gray-white differentiation is preserved. There is no mass  lesion.  There is no midline shift. Vascular: No hyperdense vessel or unexpected calcification. Skull: Normal. Negative for fracture or focal lesion. Sinuses: There is minimal mucosal thickening in the maxillary sinuses. Orbits: The globes and orbits are unremarkable. Review of the MIP images confirms the above findings CTA NECK FINDINGS Aortic arch: The aortic arch is normal. The origins of the major branch vessels are patent. Right carotid system: The right common, internal, and external carotid arteries are patent, without hemodynamically significant stenosis or occlusion. There is no dissection or aneurysm. Left carotid system: The left common, internal, and external carotid arteries are patent, without hemodynamically significant stenosis or occlusion. There is no dissection or aneurysm. Vertebral arteries: The vertebral arteries are codominant. The vertebral arteries are patent, without hemodynamically significant stenosis or occlusion. There is no dissection or aneurysm. Skeleton: There is no acute osseous abnormality or aggressive osseous lesion. Postsurgical changes are noted in the mandible bilaterally. Other neck: The soft tissues are unremarkable. Upper chest: The imaged lung apices are clear. Review of the MIP images confirms the above findings CTA HEAD FINDINGS Anterior circulation: The intracranial ICAs are patent. The bilateral MCAs are patent. The bilateral ACAs are patent. The  anterior communicating artery is patent. There is no aneurysm. Posterior circulation: The bilateral V4 segments are patent. PICA is patent bilaterally. The basilar artery is patent. The bilateral PCAs are patent. The posterior communicating arteries are not identified. There is no aneurysm. Venous sinuses: Patent. Anatomic variants: None. Review of the MIP images confirms the above findings IMPRESSION: 1. No acute intracranial pathology. 2. Normal CTA of the head and neck. Electronically Signed   By: Valetta Mole M.D.   On:  01/04/2021 12:15   MR Brain W and Wo Contrast  Result Date: 01/04/2021 CLINICAL DATA:  Neuro deficit, acute, stroke suspected EXAM: MRI HEAD WITHOUT AND WITH CONTRAST TECHNIQUE: Multiplanar, multiecho pulse sequences of the brain and surrounding structures were obtained without and with intravenous contrast. CONTRAST:  52mL GADAVIST GADOBUTROL 1 MMOL/ML IV SOLN COMPARISON:  None. FINDINGS: Brain: Small area of reduced diffusion in the left frontal lobe involving the left precentral gyrus this lateral to hand motor region and extending into the centrum semiovale. No evidence of intracranial hemorrhage. There is no intracranial mass, mass effect, or edema. There is no hydrocephalus or extra-axial fluid collection. Ventricles and sulci are normal in size and configuration. No abnormal enhancement. Vascular: Major vessel flow voids at the skull base are preserved. Skull and upper cervical spine: Normal marrow signal is preserved. Sinuses/Orbits: Minor mucosal thickening.  Orbits are unremarkable. Other: Sella is unremarkable.  Mastoid air cells are clear. 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 Electronically Signed   By: Macy Mis M.D.   On: 01/04/2021 15:11   EEG adult  Result Date: 01/06/2021 Lora Havens, Kim     01/06/2021  7:13 AM Patient Name: Jimmy Kim MRN: [id number] Epilepsy Attending: Lora Havens Referring Physician/Provider: Janine Ores, NP Date: 01/05/2021 Duration: 28.27 mins Patient history: 36 y.o. male with history of well-controlled asthma presenting with had uncontrollable, involuntary writhing of his right arm and hand followed by slurred speech which began around 0930 this morning which lasted ~1 minute. He had a somewhat similar episode in character and duration without slurred speech ~about a month ago. EEG to evaluate for seizure Level of alertness: Awake, asleep AEDs during EEG study: None Technical aspects: This EEG study was done with scalp  electrodes positioned according to the 10-20 International system of electrode placement. Electrical activity was acquired at a sampling rate of 500Hz  and reviewed with a high frequency filter of 70Hz  and a low frequency filter of 1Hz . EEG data were recorded continuously and digitally stored. Description: The posterior dominant rhythm consists of 10 Hz activity of moderate voltage (25-35 uV) seen predominantly in posterior head regions, symmetric and reactive to eye opening and eye closing. Sleep was characterized by vertex waves, sleep spindles (12 to 14 Hz), maximal frontocentral region. Physiologic photic driving was seen during photic stimulation.  Hyperventilation was not performed.   IMPRESSION: This study is within normal limits. No seizures or epileptiform discharges were seen throughout the recording. Lora Havens   ECHOCARDIOGRAM COMPLETE BUBBLE STUDY  Result Date: 01/05/2021    ECHOCARDIOGRAM REPORT   Patient Name:   Jimmy Kim Date of Exam: 01/05/2021 Medical Rec #:  [id number]       Height:       72.0 in Accession #:    [id number]      Weight:       175.0 lb Date of Birth:  Kim 21, 1987        BSA:  2.013 m² Patient Age:    35 years        BP:           111/78 mmHg Patient Gender: M               HR:           72 bpm. Exam Location:  Inpatient Procedure: 2D Echo, Cardiac Doppler, Color Doppler and Saline Contrast Bubble            Study Indications:   Stroke  History:       Patient has no prior history of Echocardiogram examinations.                Asthma.  Sonographer:   Rachel Lane RDCS Referring      [id number] KRISHNAN Phys: IMPRESSIONS  1. Left ventricular ejection fraction, by estimation, is 60 to 65%. The left ventricle has normal function. The left ventricle has no regional wall motion abnormalities. Left ventricular diastolic parameters were normal.  2. Right ventricular systolic function is normal. The right ventricular size is normal.  3. The mitral valve is grossly  normal. No evidence of mitral valve regurgitation.  4. The aortic valve is tricuspid. Aortic valve regurgitation is not visualized.  5. The inferior vena cava is normal in size with greater than 50% respiratory variability, suggesting right atrial pressure of 3 mmHg.  6. Evidence of atrial level shunting detected by color flow Doppler. Agitated saline contrast bubble study was positive with shunting observed within 3-6 cardiac cycles suggestive of interatrial shunt. There is a moderately sized patent foramen ovale with bidirectional shunting across atrial septum. Comparison(s): No prior Echocardiogram. Conclusion(s)/Recommendation(s): Findings concerning for PFO, would recommend Transesophageal Echocardiogram for clarification and evaluation for possible device closure. FINDINGS  Left Ventricle: Left ventricular ejection fraction, by estimation, is 60 to 65%. The left ventricle has normal function. The left ventricle has no regional wall motion abnormalities. The left ventricular internal cavity size was normal in size. There is  no left ventricular hypertrophy. Left ventricular diastolic parameters were normal. Right Ventricle: The right ventricular size is normal. No increase in right ventricular wall thickness. Right ventricular systolic function is normal. Left Atrium: Left atrial size was normal in size. Right Atrium: Right atrial size was normal in size. Pericardium: There is no evidence of pericardial effusion. Mitral Valve: The mitral valve is grossly normal. No evidence of mitral valve regurgitation. Tricuspid Valve: The tricuspid valve is grossly normal. Tricuspid valve regurgitation is not demonstrated. Aortic Valve: The aortic valve is tricuspid. Aortic valve regurgitation is not visualized. Aortic valve mean gradient measures 3.0 mmHg. Aortic valve peak gradient measures 5.8 mmHg. Aortic valve area, by VTI measures 3.63 cm². Pulmonic Valve: The pulmonic valve was normal in structure. Pulmonic valve  regurgitation is trivial. Aorta: The aortic root and ascending aorta are structurally normal, with no evidence of dilitation. Venous: The inferior vena cava is normal in size with greater than 50% respiratory variability, suggesting right atrial pressure of 3 mmHg. IAS/Shunts: The interatrial septum is aneurysmal. Evidence of atrial level shunting detected by color flow Doppler. Agitated saline contrast was given intravenously to evaluate for intracardiac shunting. Agitated saline contrast bubble study was positive  with shunting observed within 3-6 cardiac cycles suggestive of interatrial shunt. A moderately sized patent foramen ovale is detected with bidirectional shunting across atrial septum.  LEFT VENTRICLE PLAX 2D LVIDd:         4.70 cm   Diastology LVIDs:           3.10 cm   LV e' medial:    12.30 cm/s LV PW:         0.80 cm   LV E/e' medial:  6.0 LV IVS:        0.80 cm   LV e' lateral:   18.10 cm/s LVOT diam:     2.30 cm   LV E/e' lateral: 4.1 LV SV:         81 LV SV Index:   40 LVOT Area:     4.15 cm  RIGHT VENTRICLE RV Basal diam:  3.40 cm LEFT ATRIUM             Index        RIGHT ATRIUM           Index LA diam:        2.80 cm 1.39 cm/m   RA Area:     13.50 cm LA Vol (A2C):   33.9 ml 16.84 ml/m  RA Volume:   34.90 ml  17.34 ml/m LA Vol (A4C):   24.2 ml 12.02 ml/m LA Biplane Vol: 31.3 ml 15.55 ml/m  AORTIC VALVE AV Area (Vmax):    3.50 cm AV Area (Vmean):   3.21 cm AV Area (VTI):     3.63 cm AV Vmax:           120.00 cm/s AV Vmean:          82.000 cm/s AV VTI:            0.222 m AV Peak Grad:      5.8 mmHg AV Mean Grad:      3.0 mmHg LVOT Vmax:         101.00 cm/s LVOT Vmean:        63.400 cm/s LVOT VTI:          0.194 m LVOT/AV VTI ratio: 0.87  AORTA Ao Root diam: 3.70 cm Ao Asc diam:  2.80 cm MITRAL VALVE MV Area (PHT): 3.42 cm    SHUNTS MV Decel Time: 222 msec    Systemic VTI:  0.19 m MV E velocity: 73.90 cm/s  Systemic Diam: 2.30 cm MV A velocity: 48.20 cm/s MV E/A ratio:  1.53 Jimmy Bishop  Kim Electronically signed by Jimmy Bishop Kim Signature Date/Time: 01/05/2021/4:55:43 PM    Final    CT HEAD CODE STROKE WO CONTRAST  Result Date: 01/17/2021 CLINICAL DATA:  Code stroke. Stroke, follow up Neuro deficit, acute, stroke suspected EXAM: CT HEAD WITHOUT CONTRAST TECHNIQUE: Contiguous axial images were obtained from the base of the skull through the vertex without intravenous contrast. RADIATION DOSE REDUCTION: This exam was performed according to the departmental dose-optimization program which includes automated exposure control, adjustment of the mA and/or kV according to patient size and/or use of iterative reconstruction technique. COMPARISON:  None. FINDINGS: Brain: There is no acute intracranial hemorrhage, mass effect, or edema. Gray-white differentiation is preserved. Ventricles and sulci are normal in size and configuration. No extra-axial collection. Vascular: No hyperdense vessel. Skull: Unremarkable. Sinuses/Orbits: Minor mucosal thickening.  Orbits are unremarkable. Other: Mastoid air cells are clear. ASPECTS ([place] Stroke Program Early CT Score) - Ganglionic level infarction (caudate, lentiform nuclei, internal capsule, insula, M1-M3 cortex): 7 - Supraganglionic infarction (M4-M6 cortex): 3 Total score (0-10 with 10 being normal): 10 IMPRESSION: There is no acute intracranial hemorrhage or evidence of acute infarction. ASPECT score is 10. These results were communicated to Dr. Erlinda Hong at 1:44 pm on 01/17/2021 by text page via the [facility] messaging system.  Electronically Signed   By: Macy Mis M.D.   On: 01/17/2021 13:47   VAS [place] LOWER EXTREMITY VENOUS (DVT)  Result Date: 01/05/2021  Lower Venous DVT Study Patient Name:  KAYLYN TAFUR  Date of Exam:   01/05/2021 Medical Rec #: [id number]        Accession #:    [id number] Date of Birth: February 17, 1985         Patient Gender: M Patient Age:   47 years Exam Location:  [facility] Procedure:      VAS [place] LOWER EXTREMITY VENOUS (DVT)  Referring Phys: Gevena Barre --------------------------------------------------------------------------------  Indications: Stroke.  Comparison Study: no prior Performing Technologist: Archie Patten RVS  Examination Guidelines: A complete evaluation includes B-mode imaging, spectral Doppler, color Doppler, and power Doppler as needed of all accessible portions of each vessel. Bilateral testing is considered an integral part of a complete examination. Limited examinations for reoccurring indications may be performed as noted. The reflux portion of the exam is performed with the patient in reverse Trendelenburg.  +---------+---------------+---------+-----------+----------+--------------+  RIGHT     Compressibility Phasicity Spontaneity Properties Thrombus Aging  +---------+---------------+---------+-----------+----------+--------------+  CFV       Full            Yes       Yes                                    +---------+---------------+---------+-----------+----------+--------------+  SFJ       Full                                                             +---------+---------------+---------+-----------+----------+--------------+  FV Prox   Full                                                             +---------+---------------+---------+-----------+----------+--------------+  FV Mid    Full                                                             +---------+---------------+---------+-----------+----------+--------------+  FV Distal Full                                                             +---------+---------------+---------+-----------+----------+--------------+  PFV       Full                                                             +---------+---------------+---------+-----------+----------+--------------+  POP       Full            Yes       Yes                                    +---------+---------------+---------+-----------+----------+--------------+  PTV       Full                                                              +---------+---------------+---------+-----------+----------+--------------+  PERO      Full                                                             +---------+---------------+---------+-----------+----------+--------------+   +---------+---------------+---------+-----------+----------+--------------+  LEFT      Compressibility Phasicity Spontaneity Properties Thrombus Aging  +---------+---------------+---------+-----------+----------+--------------+  CFV       Full            Yes       Yes                                    +---------+---------------+---------+-----------+----------+--------------+  SFJ       Full                                                             +---------+---------------+---------+-----------+----------+--------------+  FV Prox   Full                                                             +---------+---------------+---------+-----------+----------+--------------+  FV Mid    Full                                                             +---------+---------------+---------+-----------+----------+--------------+  FV Distal Full                                                             +---------+---------------+---------+-----------+----------+--------------+  PFV       Full                                                             +---------+---------------+---------+-----------+----------+--------------+  POP       Full            Yes       Yes                                    +---------+---------------+---------+-----------+----------+--------------+  PTV       Full                                                             +---------+---------------+---------+-----------+----------+--------------+  PERO      Full                                                             +---------+---------------+---------+-----------+----------+--------------+     Summary: BILATERAL: - No evidence of deep vein thrombosis seen in the lower  extremities, bilaterally. -No evidence of popliteal cyst, bilaterally.   *See table(s) above for measurements and observations. Electronically signed by Jimmy Kim on 01/05/2021 at 11:35:47 AM.    Final     Assessment: 36 y.o. male with PMH of recent stroke, HLD presented to ED for strange feeling of right arm at 7:20 AM this morning, and entire body different feeling, both arms and legs jello feeling, mild while body tremulous and slight dizziness, lightheadedness at 12pm.  NIHSS = 0. CT no acute abnormality.  Patient recently admitted on 01/04/2021 for small right frontal infarct, no DVT, EF 60 to 65%, however moderate sized PFO with bidirectional shunting.  Hypercoagulable work-up largely unremarkable.  A1c 5.3, LDL 146.  He was discharged on DAPT and Lipitor 40. On 01/11/2021, he saw Dr. Bettina Gavia cardiology and scheduled for TEE on 02/08/2021 and referred to Dr. Burt Knack for PFO closure.  Patient current symptoms more likely anxiety and panic attack in the setting of recent stroke, less likely new stroke on exam.  However, would like to have MRI to confirm.    Stroke Risk Factors -  PFO, HLD  Plan: MRI brain  Neuro check and vital signs every 2 hours If no stroke, he may be discharged from ER with scheduled outpatient TEE and PFO closure.  However, if recurrent stroke on MRI, he needs to be admitted and will have more urgent TEE and PFO closure procedure. Continue home aspirin and Plavix and statin Discussed with Dr. Regenia Skeeter ED physician We will follow   Thank you for this consultation and allowing [place] to participate in the care of this patient.   Rosalin Hawking, MD PhD Stroke Neurology 01/17/2021 2:21 PM   ADDENDUM:  MRI showed no acute infarct. No further work up needed at this time. He has TEE scheduled at 02/08/21 and will see Dr. Krista Blue at [facility] 02/22/21 and Dr. Burt Knack cardiology 03/01/21. Will continue ASA and plavix and statin for now. Discussed with pt and updated about his condition and  upcoming appointments. He expressed understanding. He can be discharged from neuro standpoint.   Rosalin Hawking, MD PhD Stroke Neurology 01/17/2021 4:22 PM

## 2021-01-17 NOTE — ED Provider Triage Note (Signed)
Emergency Medicine Provider Triage Evaluation Note  Jimmy Kim , a 36 y.o. male  was evaluated in triage.  Pt complains of new stroke like sx. LKN 0700. He has RUE numbness and weakness. He has vertiginous symptoms and balance issues. He had a stroke three weeks ago with the exact same symptoms except at that time, he also had speech changes. Apparently he has no residual symptoms from previous stroke.   Review of Systems  Positive:  Negative:   Physical Exam  BP (!) 130/100 (BP Location: Left Arm)    Pulse 89    Temp 98 F (36.7 C) (Oral)    Resp 16    SpO2 100%  Gen:   Awake, no distress   Resp:  Normal effort  MSK:   Moves extremities without difficulty  Other:  Subjective RUE sensation deficit, possible subtle Right sided weakness.  Medical Decision Making  Medically screening exam initiated at 1:22 PM.  Appropriate orders placed.  Jimmy Kim was informed that the remainder of the evaluation will be completed by another provider, this initial triage assessment does not replace that evaluation, and the importance of remaining in the ED until their evaluation is complete.    Adolphus Birchwood, PA-C 01/17/21 1325

## 2021-01-28 ENCOUNTER — Other Ambulatory Visit: Payer: Self-pay

## 2021-01-28 ENCOUNTER — Ambulatory Visit (INDEPENDENT_AMBULATORY_CARE_PROVIDER_SITE_OTHER): Payer: BC Managed Care – PPO | Admitting: Neurology

## 2021-01-28 ENCOUNTER — Encounter: Payer: Self-pay | Admitting: Neurology

## 2021-01-28 VITALS — BP 121/77 | HR 60 | Ht 72.0 in | Wt 180.0 lb

## 2021-01-28 DIAGNOSIS — R2990 Unspecified symptoms and signs involving the nervous system: Secondary | ICD-10-CM

## 2021-01-28 DIAGNOSIS — H9190 Unspecified hearing loss, unspecified ear: Secondary | ICD-10-CM | POA: Insufficient documentation

## 2021-01-28 DIAGNOSIS — Q2112 Patent foramen ovale: Secondary | ICD-10-CM | POA: Diagnosis not present

## 2021-01-28 DIAGNOSIS — J302 Other seasonal allergic rhinitis: Secondary | ICD-10-CM | POA: Insufficient documentation

## 2021-01-28 DIAGNOSIS — I639 Cerebral infarction, unspecified: Secondary | ICD-10-CM | POA: Diagnosis not present

## 2021-01-28 DIAGNOSIS — G43009 Migraine without aura, not intractable, without status migrainosus: Secondary | ICD-10-CM | POA: Diagnosis not present

## 2021-01-28 MED ORDER — COENZYME Q10 30 MG PO CAPS: 200.0000 mg | ORAL_CAPSULE | Freq: Three times a day (TID) | ORAL | 0 refills | Status: DC

## 2021-01-28 NOTE — Progress Notes (Signed)
[facility] [address] [place]. [place] [address] [phone]       OFFICE CONSULT NOTE  [name] Date of Birth:  [birth year] Medical Record Number:  [id number]   Referring MD: Gevena Barre  Reason for Referral: Stroke and PFO  HPI: Jimmy Kim is a pleasant 36 year old Caucasian male seen today for initial office consultation visit for stroke and PFO.  History is obtained from the patient and review of electronic medical records and opossum reviewed available pertinent imaging films in PACS.  No significant past medical history except asthma.  He presented on 01/04/2021 with sudden onset of uncontrollable involuntary movements of the right arm which were breathing and difficult to stop.  This lasted barely a minute and stopped.  He also noticed some trouble speaking and had no control of his voice and tongue and speech was slurred.  He states he had somewhat similar episode about a month ago in which she had difficulty controlling his left upper extremity for less than a minute around Thanksgiving weekend.  He had another episode roughly a week later.  Patient did complain of a headache during 1 of these episodes.  The night prior to his episode on December 30 he had not slept well and woke up in the morning with a severe headache and then went back to sleep for an hour and a half and then woke up and about 10 minutes after waking up he had this episode.  Patient had an MRI scan of the brain which showed a linear area of restricted diffusion in the left frontal precentral gyrus with corresponding dark signal on the ADC map and extending into the centrum semiovale consistent with an acute infarct.  CT angiogram of the brain and neck showed no significant large vessel stenosis or occlusion.  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  LDL cholesterol is elevated 146 mg percent.  Hemoglobin  A1c was 5.3.  ESR was 4 mm.  EEG was negative for any seizure activity.  Hypercoagulable panel labs were mostly unremarkable except for slight elevation of IgM anticardiolipin antibody of indeterminate titer.  Lower extremity venous Dopplers were negative for DVT.  TEE has been scheduled but not yet been done.  Patient was started on dual antiplatelet therapy aspirin and Plavix and on Lipitor 40 mg daily.  He is tolerating these medications well without significant bruising or bleeding.  He does complain of some muscle aches and pains.  Patient denies any prior history of DVT, pulmonary embolism.  He does have history of stroke in his grandfather at age 3 but he does not know any details.  Patient is scheduled to see Dr. Burt Knack to discuss endovascular PFO closure.  The patient does admit to having flown to [facility] for a CME conference towards the end of October and had 10 hours of sitting in classrooms for that meeting.  He denied however leg pain swelling or discomfort.  There is no family history of DVT or pulmonary embolism.  He denies known history of migraines but on inquiry states that he does get infrequent headaches and at times has to take ibuprofen with good relief.  On one occasion he remembers seeing some light sensitivity and visual spots and had to sit down and rest.  He is working as a Pharmacist, community at [employer].  Denies smoking cigarettes or abusing alcohol or doing street drugs.   ROS:  14 system review of systems is positive for involuntary movements, slurred speech, numbness, tingling all other systems negative  PMH:  Past Medical History:  Diagnosis Date   Asthma    Stroke [facility])     Social History:  Social History   Socioeconomic History   Marital status: Married    Spouse name: Not on file   Number of children: Not on file   Years of education: Not on file   Highest education level: Not on file  Occupational History   Not on file  Tobacco Use   Smoking status:  Never    Passive exposure: Never   Smokeless tobacco: Never  Vaping Use   Vaping Use: Never used  Substance and Sexual Activity   Alcohol use: Never   Drug use: Never   Sexual activity: Yes    Birth control/protection: Condom  Other Topics Concern   Not on file  Social History Narrative   Not on file   Social Determinants of Health   Financial Resource Strain: Not on file  Food Insecurity: Not on file  Transportation Needs: Not on file  Physical Activity: Not on file  Stress: Not on file  Social Connections: Not on file  Intimate Partner Violence: Not on file    Medications:   Current Outpatient Medications on File Prior to Visit  Medication Sig Dispense Refill   albuterol (VENTOLIN HFA) 108 (90 Base) MCG/ACT inhaler Inhale 2 puffs into the lungs every 6 (six) hours as needed for wheezing or shortness of breath.     Ascorbic Acid (VITAMIN C PO) Take 1 capsule by mouth as needed (immune boost).     aspirin 81 MG chewable tablet Chew 1 tablet (81 mg total) by mouth daily. 30 tablet 2   atorvastatin (LIPITOR) 40 MG tablet Take 1 tablet (40 mg total) by mouth daily. 30 tablet 2   clopidogrel (PLAVIX) 75 MG tablet Take 1 tablet (75 mg total) by mouth daily. 20 tablet 0   Melatonin 2.5 MG CHEW Chew 2.5 mg by mouth daily as needed (sleep).     mometasone (NASONEX) 50 MCG/ACT nasal spray Place 1 spray into the nose daily as needed (nasal congestion).     OVER THE COUNTER MEDICATION Place 2 drops into both ears daily as needed (for ear pain). Similasan Ear ache relief -Homeopathic medicine     No current facility-administered medications on file prior to visit.    Allergies:   Allergies  Allergen Reactions   Other Other (See Comments)    Quinoa - intolerance    Physical Exam General: Pleasant young Caucasian male well developed, well nourished, seated, in no evident distress Head: head normocephalic and atraumatic.   Neck: supple with no carotid or supraclavicular  bruits Cardiovascular: regular rate and rhythm, no murmurs Musculoskeletal: no deformity Skin:  no rash/petichiae Vascular:  Normal pulses all extremities  Neurologic Exam Mental Status: Awake and fully alert. Oriented to place and time. Recent and remote memory intact. Attention span, concentration and fund of knowledge appropriate. Mood and affect appropriate.  Cranial Nerves: Fundoscopic exam reveals sharp disc margins. Pupils equal, briskly reactive to light. Extraocular movements full without nystagmus.  Mild esotropia of the left eye.  Visual fields full to confrontation. Hearing intact. Facial sensation intact. Face, tongue, palate moves normally and symmetrically.  Motor: Normal bulk and tone. Normal strength in all tested extremity muscles. Sensory.: intact to touch , pinprick , position and vibratory sensation.  Coordination: Rapid alternating movements normal in all  extremities. Finger-to-nose and heel-to-shin performed accurately bilaterally. Gait and Station: Arises from chair without difficulty. Stance is normal. Gait demonstrates normal stride length and balance . Able to heel, toe and tandem walk without difficulty.  Reflexes: 1+ and symmetric. Toes downgoing.   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  Interestingly somewhat similar episodes involving the left upper extremity a month prior also lasting less than a minute.  No significant vascular risk factors except mild hyperlipidemia and PFO with high risk features.  He also has episodes suggestive of complicated migraines.     PLAN: I had a long d/w patient about his recent cryptogenic stroke,atypical migraines,PFO, risk for recurrent stroke/TIAs, personally independently reviewed imaging studies and stroke evaluation results and answered  questions.Continue aspirin 81 mg daily  for secondary stroke prevention and stop Plavix now maintain strict control of hypertension with blood pressure goal below 130/90, diabetes with hemoglobin A1c goal below 6.5% and lipids with LDL cholesterol goal below 70 mg/dL. I also advised the patient to eat a healthy diet with plenty of whole grains, cereals, fruits and vegetables, exercise regularly and maintain ideal body weight .add co-Q10 200 mg daily to help with the statin myalgias.  Check transcranial Doppler bubble study to gauge size of PFO.  He has a ROPE score of 8 and 84% chance that PFO is to blame for his stroke.  Besides he also has high risk PFO features like aneurysmal interatrial septum and moderate size shunt.  Keep scheduled appointment with Dr. Burt Knack to discuss endovascular PFO closure.  He is also had a few infrequent episodes of atypical migraine which are not frequent enough to justify prophylactic medication at the present time.  Followup in the future with me in 4 months or call earlier if necessary.  Greater than 50% time during this prolonged 60-minute consultation visit was spent on counseling and coordination of care about his cryptogenic stroke, strokelike episode, complicated migraines and PFO and discussion about risk benefit of PFO closure and answering questions Antony Contras, MD  Note: This document was prepared with digital dictation and possible smart phrase technology. Any transcriptional errors that result from this process are unintentional.

## 2021-01-28 NOTE — Patient Instructions (Signed)
I had a long d/w patient about his recent cryptogenic stroke,atypical migraines,PFO, risk for recurrent stroke/TIAs, personally independently reviewed imaging studies and stroke evaluation results and answered questions.Continue aspirin 81 mg daily  for secondary stroke prevention and stop Plavix now maintain strict control of hypertension with blood pressure goal below 130/90, diabetes with hemoglobin A1c goal below 6.5% and lipids with LDL cholesterol goal below 70 mg/dL. I also advised the patient to eat a healthy diet with plenty of whole grains, cereals, fruits and vegetables, exercise regularly and maintain ideal body weight .add co-Q10 200 mg daily to help with the statin myalgias.  Check transcranial Doppler bubble study to gauge size of PFO.  He has a ROPE score of 8 and 84% chance that PFO is to blame for his stroke.  Besides he also has high risk PFO features like aneurysmal interatrial septum and moderate size shunt.  Keep scheduled appointment with Dr. Burt Knack to discuss endovascular PFO closure.  Followup in the future with me in 4 months or call earlier if necessary.

## 2021-01-31 ENCOUNTER — Encounter (HOSPITAL_COMMUNITY): Payer: Self-pay | Admitting: Cardiology

## 2021-01-31 NOTE — Progress Notes (Signed)
Attempted to obtain medical history via telephone, unable to reach at this time. Unable to leave voicemail to return pre surgical testing department's phone call,due to mailbox not set up.   

## 2021-02-04 ENCOUNTER — Telehealth: Payer: Self-pay | Admitting: Neurology

## 2021-02-04 NOTE — Telephone Encounter (Signed)
[employer] no auth req, sent Donna a message she will reach out to the patient to schedule.  °

## 2021-02-05 ENCOUNTER — Other Ambulatory Visit: Payer: Self-pay

## 2021-02-05 DIAGNOSIS — Q2112 Patent foramen ovale: Secondary | ICD-10-CM

## 2021-02-05 DIAGNOSIS — E785 Hyperlipidemia, unspecified: Secondary | ICD-10-CM | POA: Diagnosis not present

## 2021-02-05 DIAGNOSIS — I639 Cerebral infarction, unspecified: Secondary | ICD-10-CM | POA: Diagnosis not present

## 2021-02-05 NOTE — Telephone Encounter (Signed)
This encounter was created in error - please disregard.

## 2021-02-06 ENCOUNTER — Telehealth: Payer: Self-pay

## 2021-02-06 DIAGNOSIS — E785 Hyperlipidemia, unspecified: Secondary | ICD-10-CM

## 2021-02-06 LAB — COMPREHENSIVE METABOLIC PANEL
ALT: 135 IU/L — ABNORMAL HIGH (ref 0–44)
AST: 89 IU/L — ABNORMAL HIGH (ref 0–40)
Albumin/Globulin Ratio: 2 (ref 1.2–2.2)
Albumin: 4.6 g/dL (ref 4.0–5.0)
Alkaline Phosphatase: 75 IU/L (ref 44–121)
BUN/Creatinine Ratio: 14 (ref 9–20)
BUN: 13 mg/dL (ref 6–20)
Bilirubin Total: 0.3 mg/dL (ref 0.0–1.2)
CO2: 25 mmol/L (ref 20–29)
Calcium: 9.3 mg/dL (ref 8.7–10.2)
Chloride: 102 mmol/L (ref 96–106)
Creatinine, Ser: 0.94 mg/dL (ref 0.76–1.27)
Globulin, Total: 2.3 g/dL (ref 1.5–4.5)
Glucose: 89 mg/dL (ref 70–99)
Potassium: 4.4 mmol/L (ref 3.5–5.2)
Sodium: 141 mmol/L (ref 134–144)
Total Protein: 6.9 g/dL (ref 6.0–8.5)
eGFR: 110 mL/min/{1.73_m2} (ref >59)

## 2021-02-06 LAB — CBC
Hematocrit: 41.8 % (ref 37.5–51.0)
Hemoglobin: 13.4 g/dL (ref 13.0–17.7)
MCH: 25.8 pg — ABNORMAL LOW (ref 26.6–33.0)
MCHC: 32.1 g/dL (ref 31.5–35.7)
MCV: 80 fL (ref 79–97)
Platelets: 260 10*3/uL (ref 150–450)
RBC: 5.2 x10E6/uL (ref 4.14–5.80)
RDW: 12.2 % (ref 11.6–15.4)
WBC: 5.9 10*3/uL (ref 3.4–10.8)

## 2021-02-06 LAB — LIPID PANEL
Chol/HDL Ratio: 2.6 ratio (ref 0.0–5.0)
Cholesterol, Total: 94 mg/dL — ABNORMAL LOW (ref 100–199)
HDL: 36 mg/dL — ABNORMAL LOW (ref >39)
LDL Chol Calc (NIH): 46 mg/dL (ref 0–99)
Triglycerides: 46 mg/dL (ref 0–149)
VLDL Cholesterol Cal: 12 mg/dL (ref 5–40)

## 2021-02-06 LAB — LIPOPROTEIN A (LPA): Lipoprotein (a): 20.5 nmol/L (ref <75.0)

## 2021-02-06 NOTE — Telephone Encounter (Signed)
Left message on patients voicemail to please return our call.   

## 2021-02-06 NOTE — Telephone Encounter (Signed)
-----   Message from Baldo Daub, MD sent at 02/06/2021  2:10 PM EST ----- Labs are good lipids are at target but he has mildly abnormal liver function test likely related to his statin  For now lets put a statin aside wait 2 weeks and recheck her lipid profile.  If we need to resume lipid-lowering therapy we can use a nonstatin such as Zetia or if the LDL remains severely elevated we could use the injectables like Repatha.

## 2021-02-06 NOTE — Telephone Encounter (Signed)
Pt is returning call.  

## 2021-02-07 NOTE — Addendum Note (Signed)
Addended by: Delorse Limber I on: 02/07/2021 07:48 AM   Modules accepted: Orders

## 2021-02-07 NOTE — Anesthesia Preprocedure Evaluation (Addendum)
Anesthesia Evaluation  Patient identified by MRN, date of birth, ID band Patient awake    Reviewed: Allergy & Precautions, NPO status , Patient's Chart, lab work & pertinent test results  History of Anesthesia Complications Negative for: history of anesthetic complications  Airway Mallampati: II  TM Distance: >3 FB Neck ROM: Full    Dental no notable dental hx. (+) Dental Advisory Given   Pulmonary asthma ,    Pulmonary exam normal        Cardiovascular Normal cardiovascular exam  PFO   Neuro/Psych CVA, No Residual Symptoms    GI/Hepatic negative GI ROS, Neg liver ROS,   Endo/Other  negative endocrine ROS  Renal/GU negative Renal ROS     Musculoskeletal negative musculoskeletal ROS (+)   Abdominal   Peds  Hematology negative hematology ROS (+)   Anesthesia Other Findings   Reproductive/Obstetrics                            Anesthesia Physical Anesthesia Plan  ASA: 2  Anesthesia Plan: MAC   Post-op Pain Management: Minimal or no pain anticipated   Induction:   PONV Risk Score and Plan: 1 and Propofol infusion  Airway Management Planned: Natural Airway  Additional Equipment:   Intra-op Plan:   Post-operative Plan:   Informed Consent: I have reviewed the patients History and Physical, chart, labs and discussed the procedure including the risks, benefits and alternatives for the proposed anesthesia with the patient or authorized representative who has indicated his/her understanding and acceptance.     Dental advisory given  Plan Discussed with: Anesthesiologist and CRNA  Anesthesia Plan Comments:        Anesthesia Quick Evaluation

## 2021-02-07 NOTE — Telephone Encounter (Signed)
Spoke with patient regarding results and recommendation.  Patient verbalizes understanding and is agreeable to plan of care. Advised patient to call back with any issues or concerns.  

## 2021-02-08 ENCOUNTER — Other Ambulatory Visit: Payer: Self-pay

## 2021-02-08 ENCOUNTER — Ambulatory Visit (HOSPITAL_COMMUNITY)
Admission: RE | Admit: 2021-02-08 | Discharge: 2021-02-08 | Disposition: A | Discharge status: Home / Self Care | Payer: BC Managed Care – PPO | Attending: Cardiology | Admitting: Cardiology

## 2021-02-08 ENCOUNTER — Ambulatory Visit (HOSPITAL_COMMUNITY): Payer: BC Managed Care – PPO | Admitting: Anesthesiology

## 2021-02-08 ENCOUNTER — Encounter (HOSPITAL_COMMUNITY): Payer: Self-pay | Admitting: Cardiology

## 2021-02-08 ENCOUNTER — Ambulatory Visit (HOSPITAL_BASED_OUTPATIENT_CLINIC_OR_DEPARTMENT_OTHER)
Admission: RE | Admit: 2021-02-08 | Discharge: 2021-02-08 | Disposition: A | Discharge status: Home / Self Care | Payer: BC Managed Care – PPO | Source: Home / Self Care | Attending: Cardiology | Admitting: Cardiology

## 2021-02-08 ENCOUNTER — Encounter (HOSPITAL_COMMUNITY)
Admission: RE | Disposition: A | Discharge status: Home / Self Care | Payer: BC Managed Care – PPO | Source: Home / Self Care | Attending: Cardiology

## 2021-02-08 DIAGNOSIS — Z8673 Personal history of transient ischemic attack (TIA), and cerebral infarction without residual deficits: Secondary | ICD-10-CM | POA: Diagnosis not present

## 2021-02-08 DIAGNOSIS — Z7902 Long term (current) use of antithrombotics/antiplatelets: Secondary | ICD-10-CM | POA: Diagnosis not present

## 2021-02-08 DIAGNOSIS — I639 Cerebral infarction, unspecified: Secondary | ICD-10-CM | POA: Insufficient documentation

## 2021-02-08 DIAGNOSIS — I081 Rheumatic disorders of both mitral and tricuspid valves: Secondary | ICD-10-CM | POA: Diagnosis not present

## 2021-02-08 DIAGNOSIS — Z7982 Long term (current) use of aspirin: Secondary | ICD-10-CM | POA: Diagnosis not present

## 2021-02-08 DIAGNOSIS — Q2112 Patent foramen ovale: Secondary | ICD-10-CM

## 2021-02-08 DIAGNOSIS — E785 Hyperlipidemia, unspecified: Secondary | ICD-10-CM | POA: Diagnosis not present

## 2021-02-08 HISTORY — PX: BUBBLE STUDY: SHX6837

## 2021-02-08 HISTORY — PX: TEE WITHOUT CARDIOVERSION: SHX5443

## 2021-02-08 SURGERY — ECHOCARDIOGRAM, TRANSESOPHAGEAL
Anesthesia: Monitor Anesthesia Care

## 2021-02-08 MED ORDER — PROPOFOL 10 MG/ML IV BOLUS
INTRAVENOUS | Status: DC | PRN
  Administered 2021-02-08 (×2): 30 mg via INTRAVENOUS

## 2021-02-08 MED ORDER — SODIUM CHLORIDE 0.9 % IV SOLN: INTRAVENOUS | Status: DC

## 2021-02-08 MED ORDER — PROPOFOL 500 MG/50ML IV EMUL
INTRAVENOUS | Status: DC | PRN
  Administered 2021-02-08: 125 ug/kg/min via INTRAVENOUS

## 2021-02-08 MED ORDER — EPHEDRINE SULFATE-NACL 50-0.9 MG/10ML-% IV SOSY
PREFILLED_SYRINGE | INTRAVENOUS | Status: DC | PRN
  Administered 2021-02-08: 5 mg via INTRAVENOUS

## 2021-02-08 MED ORDER — LIDOCAINE 2% (20 MG/ML) 5 ML SYRINGE
INTRAMUSCULAR | Status: DC | PRN
  Administered 2021-02-08: 100 mg via INTRAVENOUS

## 2021-02-08 MED ORDER — LIDOCAINE VISCOUS HCL 2 % MT SOLN
OROMUCOSAL | Status: AC
  Filled 2021-02-08: qty 15

## 2021-02-08 NOTE — Interval H&P Note (Signed)
History and Physical Interval Note:  02/08/2021 7:39 AM  Jimmy Kim  has presented today for surgery, with the diagnosis of CEREBRAL VASCULAR ACCIDENT.  The various methods of treatment have been discussed with the patient and family. After consideration of risks, benefits and other options for treatment, the patient has consented to  Procedure(s): TRANSESOPHAGEAL ECHOCARDIOGRAM (TEE) (N/A) as a surgical intervention.  The patient's history has been reviewed, patient examined, no change in status, stable for surgery.  I have reviewed the patient's chart and labs.  Questions were answered to the patient's satisfaction.     Marshayla Mitschke Cristal Deer

## 2021-02-08 NOTE — Discharge Instructions (Signed)

## 2021-02-08 NOTE — Progress Notes (Signed)
°  Echocardiogram Echocardiogram Transesophageal has been performed.  Leta Jungling M 02/08/2021, 9:57 AM

## 2021-02-08 NOTE — Anesthesia Postprocedure Evaluation (Signed)
Anesthesia Post Note  Patient: Jimmy Kim  Procedure(s) Performed: TRANSESOPHAGEAL ECHOCARDIOGRAM (TEE) BUBBLE STUDY     Patient location during evaluation: PACU Anesthesia Type: MAC Level of consciousness: awake and alert Pain management: pain level controlled Vital Signs Assessment: post-procedure vital signs reviewed and stable Respiratory status: spontaneous breathing and respiratory function stable Cardiovascular status: stable Postop Assessment: no apparent nausea or vomiting Anesthetic complications: no   No notable events documented.  Last Vitals:  Vitals:   02/08/21 0829 02/08/21 0840  BP: 112/73 105/70  Pulse: 60 (!) 55  Resp: 17 12  Temp:    SpO2: 100% 100%    Last Pain:  Vitals:   02/08/21 0829  TempSrc:   PainSc: 0-No pain                 Longino Trefz DANIEL

## 2021-02-08 NOTE — Transfer of Care (Signed)
Immediate Anesthesia Transfer of Care Note  Patient: Jimmy Kim  Procedure(s) Performed: TRANSESOPHAGEAL ECHOCARDIOGRAM (TEE) BUBBLE STUDY  Patient Location: PACU  Anesthesia Type:MAC  Level of Consciousness: drowsy  Airway & Oxygen Therapy: Patient Spontanous Breathing  Post-op Assessment: Report given to RN and Post -op Vital signs reviewed and stable  Post vital signs: Reviewed and stable  Last Vitals:  Vitals Value Taken Time  BP 95/60   Temp    Pulse 56   Resp 10   SpO2 100     Last Pain:  Vitals:   02/08/21 0705  TempSrc: Temporal  PainSc: 0-No pain         Complications: No notable events documented.

## 2021-02-08 NOTE — CV Procedure (Signed)
° ° °  TRANSESOPHAGEAL ECHOCARDIOGRAM   NAME:  Jimmy Kim   MRN: [id number] DOB:  09-30-1985   ADMIT DATE: 02/08/2021  INDICATIONS: CVA  PROCEDURE:   Informed consent was obtained prior to the procedure. The risks, benefits and alternatives for the procedure were discussed and the patient comprehended these risks.  Risks include, but are not limited to, cough, sore throat, vomiting, nausea, somnolence, esophageal and stomach trauma or perforation, bleeding, low blood pressure, aspiration, pneumonia, infection, trauma to the teeth and death.    Procedural time out performed. The oropharynx was anesthetized with topical 1% lidocaine.    Patient received monitored anesthesia care under the supervision of Dr. Krista Blue. Patient received a total of 239.5 mg propofol during the procedure.  The transesophageal probe was inserted in the esophagus and stomach without difficulty and multiple views were obtained.    COMPLICATIONS:    There were no immediate complications.  FINDINGS:  LEFT VENTRICLE: EF = 60-65%. No regional wall motion abnormalities.  RIGHT VENTRICLE: Normal size and function.   LEFT ATRIUM: No thrombus/mass.  LEFT ATRIAL APPENDAGE: No thrombus/mass.   RIGHT ATRIUM: No thrombus. Chiari network present  AORTIC VALVE:  Trileaflet. No regurgitation. No vegetation.  MITRAL VALVE:    Normal structure. Trivial regurgitation. No vegetation.  TRICUSPID VALVE: Normal structure. Trivial regurgitation. No vegetation.  PULMONIC VALVE: Grossly normal structure. Trivial regurgitation. No apparent vegetation.  INTERATRIAL SEPTUM: Moderate sized PFO seen. There is left to right shunting seen by color Doppler. Agitated saline contrast positive for right to left shunt as well  PERICARDIUM: No effusion noted.  DESCENDING AORTA: No plaque seen   CONCLUSION: Moderate sized PFO seen with bidirectional shunt.   Jodelle Red, MD, PhD [facility]  [address] [place], [place] [address] [phone]   8:05 AM

## 2021-02-08 NOTE — Anesthesia Procedure Notes (Signed)
Procedure Name: MAC Date/Time: 02/08/2021 7:40 AM Performed by: Erick Colace, CRNA Pre-anesthesia Checklist: Patient identified, Emergency Drugs available, Suction available and Patient being monitored Patient Re-evaluated:Patient Re-evaluated prior to induction Oxygen Delivery Method: Nasal cannula Preoxygenation: Pre-oxygenation with 100% oxygen Induction Type: IV induction

## 2021-02-11 ENCOUNTER — Ambulatory Visit (HOSPITAL_COMMUNITY): Payer: BC Managed Care – PPO

## 2021-02-11 ENCOUNTER — Encounter (HOSPITAL_COMMUNITY): Payer: Self-pay | Admitting: Cardiology

## 2021-02-15 ENCOUNTER — Ambulatory Visit (HOSPITAL_COMMUNITY)
Admission: RE | Admit: 2021-02-15 | Discharge: 2021-02-15 | Disposition: A | Discharge status: Home / Self Care | Payer: BC Managed Care – PPO | Source: Ambulatory Visit | Attending: Neurology | Admitting: Neurology

## 2021-02-15 ENCOUNTER — Other Ambulatory Visit: Payer: Self-pay

## 2021-02-15 ENCOUNTER — Ambulatory Visit (HOSPITAL_COMMUNITY): Payer: BC Managed Care – PPO

## 2021-02-15 DIAGNOSIS — I639 Cerebral infarction, unspecified: Secondary | ICD-10-CM | POA: Diagnosis not present

## 2021-02-15 DIAGNOSIS — Q2112 Patent foramen ovale: Secondary | ICD-10-CM | POA: Diagnosis not present

## 2021-02-15 NOTE — Progress Notes (Signed)
TCD w/ bubble study completed.   Please see CV Proc for preliminary results.   Jadia Capers, RDMS, RVT  

## 2021-02-22 ENCOUNTER — Ambulatory Visit: Payer: BC Managed Care – PPO | Admitting: Neurology

## 2021-02-22 DIAGNOSIS — Z8673 Personal history of transient ischemic attack (TIA), and cerebral infarction without residual deficits: Secondary | ICD-10-CM | POA: Diagnosis not present

## 2021-02-22 DIAGNOSIS — Q2112 Patent foramen ovale: Secondary | ICD-10-CM | POA: Diagnosis not present

## 2021-02-22 DIAGNOSIS — E7800 Pure hypercholesterolemia, unspecified: Secondary | ICD-10-CM | POA: Diagnosis not present

## 2021-03-01 ENCOUNTER — Encounter: Payer: Self-pay | Admitting: Cardiovascular Disease

## 2021-03-01 ENCOUNTER — Ambulatory Visit (INDEPENDENT_AMBULATORY_CARE_PROVIDER_SITE_OTHER): Payer: BC Managed Care – PPO | Admitting: Cardiovascular Disease

## 2021-03-01 ENCOUNTER — Other Ambulatory Visit: Payer: Self-pay

## 2021-03-01 VITALS — BP 100/72 | HR 53 | Ht 73.0 in | Wt 175.0 lb

## 2021-03-01 DIAGNOSIS — Z01810 Encounter for preprocedural cardiovascular examination: Secondary | ICD-10-CM

## 2021-03-01 DIAGNOSIS — Q2112 Patent foramen ovale: Secondary | ICD-10-CM | POA: Diagnosis not present

## 2021-03-01 NOTE — H&P (View-Only) (Signed)
Cardiology Office Note:    Date:  03/01/2021   ID:  Jimmy Roysndrew C Higby, DOB 04/03/1985, MRN [id number]  PCP:  Tally JoeSwayne, David, MD   [facility] Providers Cardiologist:  None     Referring MD: Baldo DaubMunley, Brian J, MD   Chief Complaint  Patient presents with   PFO Consult    History of Present Illness:    Jimmy Kim is a 36 y.o. male presenting for evaluation of PFO.  The patient was hospitalized December 30 with a small right frontal stroke after presenting with right hand weakness and slurred speech.  MRI confirmed a right frontal stroke.  His evaluation showed no evidence of DVT.  Hypercoagulable panel was essentially negative.  He was found to have a moderate sized PFO on 2D echo bubble imaging.  He returned to the emergency department with similar symptoms on January 12 but his MRI showed resolution of his prior infarct and no new events.  The patient was found to have no evidence of large vessel vascular disease.  He underwent a transcranial Doppler study that showed Spencer grade 3 shunt indicative of a moderate sized PFO.  A transesophageal echo was performed demonstrating bidirectional shunting and positive bubble study at the level of the atrial septum consistent with a moderate PFO.  He presents today to discuss PFO closure.  He has had no interval neurologic symptoms.  The patient denies chest pain, chest pressure, shortness of breath, or heart palpitations.  Past Medical History:  Diagnosis Date   Asthma    Stroke [facility](HCC)     Past Surgical History:  Procedure Laterality Date   BUBBLE STUDY  02/08/2021   Procedure: BUBBLE STUDY;  Surgeon: Jodelle Redhristopher, Bridgette, MD;  [facility];  Service: Cardiovascular;;   MANDIBLE SURGERY     TEE WITHOUT CARDIOVERSION N/A 02/08/2021   Procedure: TRANSESOPHAGEAL ECHOCARDIOGRAM (TEE);  Surgeon: Jodelle Redhristopher, Bridgette, MD;  [facility];  Service: Cardiovascular;  Laterality: N/A;    Current Medications: Current Meds   Medication Sig   albuterol (VENTOLIN HFA) 108 (90 Base) MCG/ACT inhaler Inhale 2 puffs into the lungs every 6 (six) hours as needed for wheezing or shortness of breath.   Ascorbic Acid (VITAMIN C PO) Take 1 capsule by mouth as needed (immune boost).   aspirin 81 MG chewable tablet Chew 1 tablet (81 mg total) by mouth daily.   Melatonin 2.5 MG CHEW Chew 2.5 mg by mouth daily as needed (sleep).   mometasone (NASONEX) 50 MCG/ACT nasal spray Place 1 spray into the nose daily as needed (nasal congestion).   [DISCONTINUED] OVER THE COUNTER MEDICATION Place 2 drops into both ears daily as needed (for ear pain). Similasan Ear ache relief -Homeopathic medicine     Allergies:   Other   Social History   Socioeconomic History   Marital status: Married    Spouse name: Not on file   Number of children: Not on file   Years of education: Not on file   Highest education level: Not on file  Occupational History   Not on file  Tobacco Use   Smoking status: Never    Passive exposure: Never   Smokeless tobacco: Never  Vaping Use   Vaping Use: Never used  Substance and Sexual Activity   Alcohol use: Never   Drug use: Never   Sexual activity: Yes    Birth control/protection: Condom  Other Topics Concern   Not on file  Social History Narrative   Not on file   Social  Determinants of Health   Financial Resource Strain: Not on file  Food Insecurity: Not on file  Transportation Needs: Not on file  Physical Activity: Not on file  Stress: Not on file  Social Connections: Not on file     Family History: The patient's family history includes Healthy in his father; Neurologic Disorder in his mother; Stroke in his paternal grandfather.  ROS:   Please see the history of present illness.    All other systems reviewed and are negative.  EKGs/Labs/Other Studies Reviewed:    The following studies were reviewed today: TCD Bubble Study: Summary:      A vascular evaluation was performed. The  right middle cerebral artery was  studied. An IV was inserted into the patient's left antecubital. Verbal  informed consent was obtained.     Less than 100 HITS (high intensity transient signals were observed at  rest, indicating a Spencer Grade 3 PFO (patent foramen ovale). A partial  curtain of HITS (high intensity transient signals was observed witth  valsalva, indicating a Spencer Grade 4 PFO   (patent foramen ovale) with valsalva.   Positive TCD Bubble study indicative of a moderate size right to left  shunt   TEE:  1. Left ventricular ejection fraction, by estimation, is 60 to 65%. The  left ventricle has normal function. The left ventricle has no regional  wall motion abnormalities.   2. Right ventricular systolic function is normal. The right ventricular  size is normal.   3. No left atrial/left atrial appendage thrombus was detected.   4. The mitral valve is normal in structure. Trivial mitral valve  regurgitation. No evidence of mitral stenosis.   5. The aortic valve is tricuspid. Aortic valve regurgitation is not  visualized. No aortic stenosis is present.   6. Evidence of atrial level shunting detected by color flow Doppler.  Agitated saline contrast bubble study was positive with shunting observed  within 3-6 cardiac cycles suggestive of interatrial shunt. There is a  moderately sized patent foramen ovale  with bidirectional shunting across atrial septum.   Conclusion(s)/Recommendation(s): Findings are concerning for an  interatrial shunt as detailed above.   Recent Labs: 02/05/2021: ALT 135; BUN 13; Creatinine, Ser 0.94; Hemoglobin 13.4; Platelets 260; Potassium 4.4; Sodium 141  Recent Lipid Panel    Component Value Date/Time   CHOL 94 (L) 02/05/2021 1600   TRIG 46 02/05/2021 1600   HDL 36 (L) 02/05/2021 1600   CHOLHDL 2.6 02/05/2021 1600   CHOLHDL 4.3 01/05/2021 0204   VLDL 14 01/05/2021 0204   LDLCALC 46 02/05/2021 1600     Risk Assessment/Calculations:            Physical Exam:    VS:  BP 100/72    Pulse (!) 53    Ht 6\' 1"  (1.854 m)    Wt 175 lb (79.4 kg)    SpO2 96%    BMI 23.09 kg/m     Wt Readings from Last 3 Encounters:  03/01/21 175 lb (79.4 kg)  01/28/21 180 lb (81.6 kg)  01/11/21 178 lb (80.7 kg)     GEN:  Well nourished, well developed in no acute distress HEENT: Normal NECK: No JVD; No carotid bruits LYMPHATICS: No lymphadenopathy CARDIAC: RRR, no murmurs, rubs, gallops RESPIRATORY:  Clear to auscultation without rales, wheezing or rhonchi  ABDOMEN: Soft, non-tender, non-distended MUSCULOSKELETAL:  No edema; No deformity  SKIN: Warm and dry NEUROLOGIC:  Alert and oriented x 3 PSYCHIATRIC:  Normal affect  ASSESSMENT:    1. PFO (patent foramen ovale)   2. Pre-procedural cardiovascular examination    PLAN:    In order of problems listed above:  Hospital records are reviewed.  Imaging studies including the transcranial Doppler study, brain MRI, and transesophageal and transthoracic echo images are all personally reviewed.  The patient appears to have a small to moderate sized PFO based on evaluation of color flow and bubble study.  There were no significant valvular lesions.  LV and RV function are normal.  We reviewed treatment options at length.  We contrasted medical therapy with antiplatelet agents versus device closure of the PFO. For this patients PFO, there is a high likelihood of contribution to stroke/TIA with a RoPE score of 7 to 10  Above score calculated as 1 point each if NOT present [HTN, DM2, h/o CVA/TIA, Smoker, Cortical imaging on infarct] Above score calculated as 5 points if [Age 80 to 29], 4 points if [Age 35 to 39], 3 points if [Age 90 to 49], 2 points if [Age 70 to 59], 1 point if [Age 96 to 69], 0 points if [Age > 70]  After discussion of treatment options, the patient would like to proceed with PFO closure.  We discussed the randomized controlled trial data contrasting transcatheter PFO closure  versus medical therapy.  The patient understands that the risks include vascular injury, bleeding, infection, stroke, myocardial infarction, atrial fibrillation, cardiac injury, cardiac tamponade with hemopericardium, need for emergency pericardiocentesis, device embolization, need for emergency cardiac surgery, late device erosion, and death.  He understands the risks of serious complications arise at a rate of less than 1%.  He will be started on clopidogrel.  We discussed the need for dual antiplatelet therapy with aspirin and clopidogrel for 6 months after the procedure and SBE prophylaxis when indicated for 6 months from the time of the procedure.   Medication Adjustments/Labs and Tests Ordered: Current medicines are reviewed at length with the patient today.  Concerns regarding medicines are outlined above.  Orders Placed This Encounter  Procedures   Basic metabolic panel   CBC   EKG 12-Lead   No orders of the defined types were placed in this encounter.   Patient Instructions  Medication Instructions:  ReSTART Plavix tonight *If you need a refill on your cardiac medications before your next appointment, please call your pharmacy*   Lab Work: BMET, CBC 03/04/21 If you have labs (blood work) drawn today and your tests are completely normal, you will receive your results only by: MyChart Message (if you have MyChart) OR A paper copy in the mail If you have any lab test that is abnormal or we need to change your treatment, we will call you to review the results.   Testing/Procedures: PFO Closure   Follow-Up: Structural team will follow up   Provider:   Richard Miu, MD  03/01/2021 5:30 PM    [facility]

## 2021-03-01 NOTE — Patient Instructions (Addendum)
Medication Instructions:  ReSTART Plavix tonight *If you need a refill on your cardiac medications before your next appointment, please call your pharmacy*   Lab Work: BMET, CBC 03/04/21 If you have labs (blood work) drawn today and your tests are completely normal, you will receive your results only by: MyChart Message (if you have MyChart) OR A paper copy in the mail If you have any lab test that is abnormal or we need to change your treatment, we will call you to review the results.   Testing/Procedures: PFO Closure   Follow-Up: Structural team will follow up   Provider:   [name]

## 2021-03-01 NOTE — Progress Notes (Signed)
Cardiology Office Note:    Date:  03/01/2021   ID:  Jimmy Kim, DOB 1985-06-13, MRN [id number]  PCP:  Jimmy Joe, MD   [facility] Providers Cardiologist:  None     Referring MD: Jimmy Daub, MD   Chief Complaint  Patient presents with   PFO Consult    History of Present Illness:    Jimmy Kim is a 36 y.o. male presenting for evaluation of PFO.  The patient was hospitalized December 30 with a small right frontal stroke after presenting with right hand weakness and slurred speech.  MRI confirmed a right frontal stroke.  His evaluation showed no evidence of DVT.  Hypercoagulable panel was essentially negative.  He was found to have a moderate sized PFO on 2D echo bubble imaging.  He returned to the emergency department with similar symptoms on January 12 but his MRI showed resolution of his prior infarct and no new events.  The patient was found to have no evidence of large vessel vascular disease.  He underwent a transcranial Doppler study that showed Jimmy Kim grade 3 shunt indicative of a moderate sized PFO.  A transesophageal echo was performed demonstrating bidirectional shunting and positive bubble study at the level of the atrial septum consistent with a moderate PFO.  He presents today to discuss PFO closure.  He has had no interval neurologic symptoms.  The patient denies chest pain, chest pressure, shortness of breath, or heart palpitations.  Past Medical History:  Diagnosis Date   Asthma    Stroke [facility])     Past Surgical History:  Procedure Laterality Date   BUBBLE STUDY  02/08/2021   Procedure: BUBBLE STUDY;  Surgeon: Jodelle Red, MD;  Location: [facility] ENDOSCOPY;  Service: Cardiovascular;;   MANDIBLE SURGERY     TEE WITHOUT CARDIOVERSION N/A 02/08/2021   Procedure: TRANSESOPHAGEAL ECHOCARDIOGRAM (TEE);  Surgeon: Jodelle Red, MD;  Location: [facility] ENDOSCOPY;  Service: Cardiovascular;  Laterality: N/A;    Current Medications: Current Meds   Medication Sig   albuterol (VENTOLIN HFA) 108 (90 Base) MCG/ACT inhaler Inhale 2 puffs into the lungs every 6 (six) hours as needed for wheezing or shortness of breath.   Ascorbic Acid (VITAMIN C PO) Take 1 capsule by mouth as needed (immune boost).   aspirin 81 MG chewable tablet Chew 1 tablet (81 mg total) by mouth daily.   Melatonin 2.5 MG CHEW Chew 2.5 mg by mouth daily as needed (sleep).   mometasone (NASONEX) 50 MCG/ACT nasal spray Place 1 spray into the nose daily as needed (nasal congestion).   [DISCONTINUED] OVER THE COUNTER MEDICATION Place 2 drops into both ears daily as needed (for ear pain). Similasan Ear ache relief -Homeopathic medicine     Allergies:   Other   Social History   Socioeconomic History   Marital status: Married    Spouse name: Not on file   Number of children: Not on file   Years of education: Not on file   Highest education level: Not on file  Occupational History   Not on file  Tobacco Use   Smoking status: Never    Passive exposure: Never   Smokeless tobacco: Never  Vaping Use   Vaping Use: Never used  Substance and Sexual Activity   Alcohol use: Never   Drug use: Never   Sexual activity: Yes    Birth control/protection: Condom  Other Topics Concern   Not on file  Social History Narrative   Not on file   Social  Determinants of Health   Financial Resource Strain: Not on file  Food Insecurity: Not on file  Transportation Needs: Not on file  Physical Activity: Not on file  Stress: Not on file  Social Connections: Not on file     Family History: The patient's family history includes Healthy in his father; Neurologic Disorder in his mother; Stroke in his paternal grandfather.  ROS:   Please see the history of present illness.    All other systems reviewed and are negative.  EKGs/Labs/Other Studies Reviewed:    The following studies were reviewed today: TCD Bubble Study: Summary:      A vascular evaluation was performed. The  right middle cerebral artery was  studied. An IV was inserted into the patient's left antecubital. Verbal  informed consent was obtained.     Less than 100 HITS (high intensity transient signals were observed at  rest, indicating a Jimmy Kim Grade 3 PFO (patent foramen ovale). A partial  curtain of HITS (high intensity transient signals was observed witth  valsalva, indicating a Jimmy Kim Grade 4 PFO   (patent foramen ovale) with valsalva.   Positive TCD Bubble study indicative of a moderate size right to left  shunt   TEE:  1. Left ventricular ejection fraction, by estimation, is 60 to 65%. The  left ventricle has normal function. The left ventricle has no regional  wall motion abnormalities.   2. Right ventricular systolic function is normal. The right ventricular  size is normal.   3. No left atrial/left atrial appendage thrombus was detected.   4. The mitral valve is normal in structure. Trivial mitral valve  regurgitation. No evidence of mitral stenosis.   5. The aortic valve is tricuspid. Aortic valve regurgitation is not  visualized. No aortic stenosis is present.   6. Evidence of atrial level shunting detected by color flow Doppler.  Agitated saline contrast bubble study was positive with shunting observed  within 3-6 cardiac cycles suggestive of interatrial shunt. There is a  moderately sized patent foramen ovale  with bidirectional shunting across atrial septum.   Conclusion(s)/Recommendation(s): Findings are concerning for an  interatrial shunt as detailed above.   Recent Labs: 02/05/2021: ALT 135; BUN 13; Creatinine, Ser 0.94; Hemoglobin 13.4; Platelets 260; Potassium 4.4; Sodium 141  Recent Lipid Panel    Component Value Date/Time   CHOL 94 (L) 02/05/2021 1600   TRIG 46 02/05/2021 1600   HDL 36 (L) 02/05/2021 1600   CHOLHDL 2.6 02/05/2021 1600   CHOLHDL 4.3 01/05/2021 0204   VLDL 14 01/05/2021 0204   LDLCALC 46 02/05/2021 1600     Risk Assessment/Calculations:            Physical Exam:    VS:  BP 100/72    Pulse (!) 53    Ht 6\' 1"  (1.854 m)    Wt 175 lb (79.4 kg)    SpO2 96%    BMI 23.09 kg/m     Wt Readings from Last 3 Encounters:  03/01/21 175 lb (79.4 kg)  01/28/21 180 lb (81.6 kg)  01/11/21 178 lb (80.7 kg)     GEN:  Well nourished, well developed in no acute distress HEENT: Normal NECK: No JVD; No carotid bruits LYMPHATICS: No lymphadenopathy CARDIAC: RRR, no murmurs, rubs, gallops RESPIRATORY:  Clear to auscultation without rales, wheezing or rhonchi  ABDOMEN: Soft, non-tender, non-distended MUSCULOSKELETAL:  No edema; No deformity  SKIN: Warm and dry NEUROLOGIC:  Alert and oriented x 3 PSYCHIATRIC:  Normal affect  ASSESSMENT:    1. PFO (patent foramen ovale)   2. Pre-procedural cardiovascular examination    PLAN:    In order of problems listed above:  Hospital records are reviewed.  Imaging studies including the transcranial Doppler study, brain MRI, and transesophageal and transthoracic echo images are all personally reviewed.  The patient appears to have a small to moderate sized PFO based on evaluation of color flow and bubble study.  There were no significant valvular lesions.  LV and RV function are normal.  We reviewed treatment options at length.  We contrasted medical therapy with antiplatelet agents versus device closure of the PFO. For this patients PFO, there is a high likelihood of contribution to stroke/TIA with a RoPE score of 7 to 10  Above score calculated as 1 point each if NOT present [HTN, DM2, h/o CVA/TIA, Smoker, Cortical imaging on infarct] Above score calculated as 5 points if [Age 80 to 29], 4 points if [Age 35 to 39], 3 points if [Age 90 to 49], 2 points if [Age 70 to 59], 1 point if [Age 96 to 69], 0 points if [Age > 70]  After discussion of treatment options, the patient would like to proceed with PFO closure.  We discussed the randomized controlled trial data contrasting transcatheter PFO closure  versus medical therapy.  The patient understands that the risks include vascular injury, bleeding, infection, stroke, myocardial infarction, atrial fibrillation, cardiac injury, cardiac tamponade with hemopericardium, need for emergency pericardiocentesis, device embolization, need for emergency cardiac surgery, late device erosion, and death.  He understands the risks of serious complications arise at a rate of less than 1%.  He will be started on clopidogrel.  We discussed the need for dual antiplatelet therapy with aspirin and clopidogrel for 6 months after the procedure and SBE prophylaxis when indicated for 6 months from the time of the procedure.   Medication Adjustments/Labs and Tests Ordered: Current medicines are reviewed at length with the patient today.  Concerns regarding medicines are outlined above.  Orders Placed This Encounter  Procedures   Basic metabolic panel   CBC   EKG 12-Lead   No orders of the defined types were placed in this encounter.   Patient Instructions  Medication Instructions:  ReSTART Plavix tonight *If you need a refill on your cardiac medications before your next appointment, please call your pharmacy*   Lab Work: BMET, CBC 03/04/21 If you have labs (blood work) drawn today and your tests are completely normal, you will receive your results only by: MyChart Message (if you have MyChart) OR A paper copy in the mail If you have any lab test that is abnormal or we need to change your treatment, we will call you to review the results.   Testing/Procedures: PFO Closure   Follow-Up: Structural team will follow up   Provider:   Richard Miu, MD  03/01/2021 5:30 PM    [facility]

## 2021-03-04 ENCOUNTER — Other Ambulatory Visit: Payer: Self-pay

## 2021-03-04 ENCOUNTER — Other Ambulatory Visit: Payer: BC Managed Care – PPO | Admitting: *Deleted

## 2021-03-04 DIAGNOSIS — Q2112 Patent foramen ovale: Secondary | ICD-10-CM | POA: Diagnosis not present

## 2021-03-04 DIAGNOSIS — Z01810 Encounter for preprocedural cardiovascular examination: Secondary | ICD-10-CM | POA: Diagnosis not present

## 2021-03-05 ENCOUNTER — Telehealth: Payer: Self-pay | Admitting: *Deleted

## 2021-03-05 NOTE — Telephone Encounter (Signed)
Patient returning call. States he may be seeing patient's in the morning when he gets a call back just FYI.

## 2021-03-05 NOTE — Telephone Encounter (Signed)
No answer, voicemail message. 

## 2021-03-05 NOTE — Telephone Encounter (Signed)
PFO Closure scheduled at [facility] for: Thursday March 07, 2021 8 AM (this is time change from 7:30 AM) [facility] Main Entrance A [facility]) at: 6 AM   Diet-no solid food after midnight prior to cath, clear liquids until 5 AM day of procedure.  Medication instructions for procedure: -Usual morning medications can be taken pre-cath with sips of water including aspirin 81 mg and Plavix 75 mg    Must have responsible adult to drive home post procedure and be with patient first 24 hours after arriving home.  [facility] does allow one visitor to wait in the waiting room during the time you are there.   Patient reports does not currently have any new symptoms concerning for COVID-19 and no household members with COVID-19 like illness.    Left message to call back to discuss procedure instructions/arrival time change.

## 2021-03-06 LAB — CBC
Hematocrit: 44.1 % (ref 37.5–51.0)
Hemoglobin: 14.2 g/dL (ref 13.0–17.7)
MCH: 26 pg — ABNORMAL LOW (ref 26.6–33.0)
MCHC: 32.2 g/dL (ref 31.5–35.7)
MCV: 81 fL (ref 79–97)
Platelets: 242 10*3/uL (ref 150–450)
RBC: 5.46 x10E6/uL (ref 4.14–5.80)
RDW: 12.2 % (ref 11.6–15.4)
WBC: 6.2 10*3/uL (ref 3.4–10.8)

## 2021-03-06 LAB — BASIC METABOLIC PANEL
BUN/Creatinine Ratio: 14 (ref 9–20)
BUN: 13 mg/dL (ref 6–20)
CO2: 22 mmol/L (ref 20–29)
Calcium: 9.7 mg/dL (ref 8.7–10.2)
Chloride: 103 mmol/L (ref 96–106)
Creatinine, Ser: 0.95 mg/dL (ref 0.76–1.27)
Glucose: 90 mg/dL (ref 70–99)
Potassium: 4.4 mmol/L (ref 3.5–5.2)
Sodium: 143 mmol/L (ref 134–144)
eGFR: 108 mL/min/{1.73_m2} (ref >59)

## 2021-03-06 NOTE — Telephone Encounter (Signed)
Reviewed procedure instructions, discussed procedure/arrival time change with patient. ?

## 2021-03-07 ENCOUNTER — Ambulatory Visit (HOSPITAL_BASED_OUTPATIENT_CLINIC_OR_DEPARTMENT_OTHER): Payer: BC Managed Care – PPO

## 2021-03-07 ENCOUNTER — Encounter (HOSPITAL_COMMUNITY)
Admission: RE | Disposition: A | Discharge status: Home / Self Care | Payer: BC Managed Care – PPO | Source: Home / Self Care | Attending: Cardiovascular Disease

## 2021-03-07 ENCOUNTER — Ambulatory Visit (HOSPITAL_COMMUNITY)
Admission: RE | Admit: 2021-03-07 | Discharge: 2021-03-07 | Disposition: A | Discharge status: Home / Self Care | Payer: BC Managed Care – PPO | Attending: Cardiovascular Disease | Admitting: Cardiovascular Disease

## 2021-03-07 ENCOUNTER — Encounter: Payer: Self-pay | Admitting: *Deleted

## 2021-03-07 ENCOUNTER — Encounter (HOSPITAL_COMMUNITY): Payer: Self-pay | Admitting: Cardiovascular Disease

## 2021-03-07 ENCOUNTER — Other Ambulatory Visit: Payer: Self-pay

## 2021-03-07 DIAGNOSIS — Z8673 Personal history of transient ischemic attack (TIA), and cerebral infarction without residual deficits: Secondary | ICD-10-CM | POA: Diagnosis not present

## 2021-03-07 DIAGNOSIS — Q2112 Patent foramen ovale: Secondary | ICD-10-CM | POA: Diagnosis not present

## 2021-03-07 DIAGNOSIS — Q211 Atrial septal defect, unspecified: Secondary | ICD-10-CM | POA: Diagnosis not present

## 2021-03-07 DIAGNOSIS — Z006 Encounter for examination for normal comparison and control in clinical research program: Secondary | ICD-10-CM

## 2021-03-07 HISTORY — PX: PATENT FORAMEN OVALE(PFO) CLOSURE: CATH118300

## 2021-03-07 LAB — ECHOCARDIOGRAM LIMITED
Height: 73 in
Weight: 2720 oz

## 2021-03-07 LAB — POCT ACTIVATED CLOTTING TIME: Activated Clotting Time: 299 seconds

## 2021-03-07 SURGERY — PATENT FORAMEN OVALE (PFO) CLOSURE
Anesthesia: LOCAL

## 2021-03-07 MED ORDER — FENTANYL CITRATE (PF) 100 MCG/2ML IJ SOLN
INTRAMUSCULAR | Status: AC
  Filled 2021-03-07: qty 2

## 2021-03-07 MED ORDER — MIDAZOLAM HCL 2 MG/2ML IJ SOLN
INTRAMUSCULAR | Status: AC
  Filled 2021-03-07: qty 2

## 2021-03-07 MED ORDER — SODIUM CHLORIDE 0.9 % IV SOLN: 250.0000 mL | INTRAVENOUS | Status: DC | PRN

## 2021-03-07 MED ORDER — HEPARIN (PORCINE) IN NACL 1000-0.9 UT/500ML-% IV SOLN
INTRAVENOUS | Status: AC
  Filled 2021-03-07: qty 500

## 2021-03-07 MED ORDER — SODIUM CHLORIDE 0.9 % WEIGHT BASED INFUSION
3.0000 mL/kg/h | INTRAVENOUS | Status: AC
  Administered 2021-03-07: 3 mL/kg/h via INTRAVENOUS

## 2021-03-07 MED ORDER — SODIUM CHLORIDE 0.9% FLUSH: 3.0000 mL | Freq: Two times a day (BID) | INTRAVENOUS | Status: DC

## 2021-03-07 MED ORDER — LABETALOL HCL 5 MG/ML IV SOLN: 10.0000 mg | INTRAVENOUS | Status: DC | PRN

## 2021-03-07 MED ORDER — CLOPIDOGREL BISULFATE 75 MG PO TABS
75.0000 mg | ORAL_TABLET | ORAL | Status: DC
  Filled 2021-03-07: qty 1

## 2021-03-07 MED ORDER — SODIUM CHLORIDE 0.9% FLUSH: 3.0000 mL | INTRAVENOUS | Status: DC | PRN

## 2021-03-07 MED ORDER — ASPIRIN 81 MG PO CHEW
81.0000 mg | CHEWABLE_TABLET | ORAL | Status: DC
  Filled 2021-03-07: qty 1

## 2021-03-07 MED ORDER — HEPARIN SODIUM (PORCINE) 1000 UNIT/ML IJ SOLN
INTRAMUSCULAR | Status: DC | PRN
  Administered 2021-03-07: 10000 [IU] via INTRAVENOUS

## 2021-03-07 MED ORDER — LIDOCAINE HCL (PF) 1 % IJ SOLN
INTRAMUSCULAR | Status: AC
  Filled 2021-03-07: qty 30

## 2021-03-07 MED ORDER — HEPARIN (PORCINE) IN NACL 1000-0.9 UT/500ML-% IV SOLN
INTRAVENOUS | Status: DC | PRN
  Administered 2021-03-07 (×2): 500 mL

## 2021-03-07 MED ORDER — ACETAMINOPHEN 325 MG PO TABS: 650.0000 mg | ORAL_TABLET | ORAL | Status: DC | PRN

## 2021-03-07 MED ORDER — HYDRALAZINE HCL 20 MG/ML IJ SOLN: 10.0000 mg | INTRAMUSCULAR | Status: DC | PRN

## 2021-03-07 MED ORDER — FENTANYL CITRATE (PF) 100 MCG/2ML IJ SOLN
INTRAMUSCULAR | Status: DC | PRN
  Administered 2021-03-07: 50 ug via INTRAVENOUS
  Administered 2021-03-07: 25 ug via INTRAVENOUS

## 2021-03-07 MED ORDER — LIDOCAINE HCL (PF) 1 % IJ SOLN
INTRAMUSCULAR | Status: DC | PRN
  Administered 2021-03-07: 15 mL

## 2021-03-07 MED ORDER — CEFAZOLIN SODIUM-DEXTROSE 2-4 GM/100ML-% IV SOLN
2.0000 g | INTRAVENOUS | Status: AC
  Administered 2021-03-07: 2 g via INTRAVENOUS
  Filled 2021-03-07: qty 100

## 2021-03-07 MED ORDER — SODIUM CHLORIDE 0.9 % WEIGHT BASED INFUSION
1.0000 mL/kg/h | INTRAVENOUS | Status: DC
  Administered 2021-03-07: 500 mL via INTRAVENOUS

## 2021-03-07 MED ORDER — HEPARIN SODIUM (PORCINE) 1000 UNIT/ML IJ SOLN
INTRAMUSCULAR | Status: AC
  Filled 2021-03-07: qty 10

## 2021-03-07 MED ORDER — ONDANSETRON HCL 4 MG/2ML IJ SOLN: 4.0000 mg | Freq: Four times a day (QID) | INTRAMUSCULAR | Status: DC | PRN

## 2021-03-07 MED ORDER — MIDAZOLAM HCL 2 MG/2ML IJ SOLN
INTRAMUSCULAR | Status: DC | PRN
  Administered 2021-03-07: 2 mg via INTRAVENOUS
  Administered 2021-03-07: 1 mg via INTRAVENOUS

## 2021-03-07 SURGICAL SUPPLY — 18 items
CATH ACUNAV 8FR 90CM (CATHETERS) ×1 IMPLANT
CATH INFINITI 6F MPA2 100CM (CATHETERS) ×1 IMPLANT
CLOSURE PERCLOSE PROSTYLE (VASCULAR PRODUCTS) ×2 IMPLANT
COVER SWIFTLINK CONNECTOR (BAG) ×1 IMPLANT
GUIDEWIRE AMPLATZER 1.5JX260 (WIRE) ×1 IMPLANT
KIT MICROPUNCTURE NIT STIFF (SHEATH) ×1 IMPLANT
OCCLUDER PFO TALISMAN 18-18 (Prosthesis & Implant Heart) IMPLANT
PACK CARDIAC CATHETERIZATION (CUSTOM PROCEDURE TRAY) ×2 IMPLANT
PROTECTION STATION PRESSURIZED (MISCELLANEOUS) ×2
SHEATH DELIVERY TALISMAN 8F 80 (SHEATH) IMPLANT
SHEATH INTROD W/O MIN 9FR 25CM (SHEATH) ×1 IMPLANT
SHEATH PINNACLE 8F 10CM (SHEATH) ×1 IMPLANT
SHEATH PROBE COVER 6X72 (BAG) ×1 IMPLANT
STATION PROTECTION PRESSURIZED (MISCELLANEOUS) IMPLANT
TALISMAN DELIVERY SHEATH 8F 80 (SHEATH) ×2
TALISMAN PFO OCCLUDER 18-18 (Prosthesis & Implant Heart) ×2 IMPLANT
TRANSDUCER W/STOPCOCK (MISCELLANEOUS) ×2 IMPLANT
WIRE EMERALD 3MM-J .035X150CM (WIRE) ×1 IMPLANT

## 2021-03-07 NOTE — Interval H&P Note (Signed)
History and Physical Interval Note: ? ?03/07/2021 ?8:02 AM ? ?Jimmy Kim  has presented today for surgery, with the diagnosis of PFO.  The various methods of treatment have been discussed with the patient and family. After consideration of risks, benefits and other options for treatment, the patient has consented to  Procedure(s): ?PATENT FORAMEN OVALE (PFO) CLOSURE (N/A) as a surgical intervention.  The patient's history has been reviewed, patient examined, no change in status, stable for surgery.  I have reviewed the patient's chart and labs.  Questions were answered to the patient's satisfaction.   ? ? ?[name] ? ? ?

## 2021-03-07 NOTE — Progress Notes (Addendum)
Dr. Excell Seltzer came and evaluated rt groin at aprrox 1230, stated should stay til  1400 and could then be walked. ? ?1250- pressure dressing applied to right groin. ?

## 2021-03-07 NOTE — Progress Notes (Signed)
1312- PA Noreene Larsson came to asses right groin and was made aware of pressure dressing that was applied . ?

## 2021-03-07 NOTE — Discharge Instructions (Addendum)

## 2021-03-07 NOTE — Research (Signed)
[employer] PFO Note ?  ?  ?  ?  ?  ?  ?  ?  ?Subject Name:   Jimmy Kim ? ? ?Subject met inclusion and exclusion criteria.  The informed consent form, study requirements and expectations were reviewed with the subject and questions and concerns were addressed prior to the signing of the consent form.  The subject verbalized understanding of the trial requirements.  The subject agreed to participate in the [employer] PFO STUDY and signed the informed consent on 03-07-2021 at 06:49 a.m. The informed consent was obtained prior to performance of any protocol-specific procedures for the subject.  A copy of the signed informed consent was given to the subject and a copy was placed in the subject's medical record. ?  ?Kenya Chalmers, Research Assistant ?

## 2021-03-07 NOTE — Progress Notes (Signed)
?  Echocardiogram ?2D Echocardiogram has been performed. ? Jimmy Kim ?03/07/2021, 10:25 AM ?

## 2021-03-07 NOTE — Progress Notes (Signed)
?  [facility]   ?MULTIDISCIPLINARY HEART VALVE TEAM ? ?Patient seen in the post-procedure setting due to small hematoma. Pressure dressing applied by RN which remains in place. No overt evidence of bleeding or hematoma at this time. Site care reviewed with patient. Will need top refrain from lifting or prolonged ambulation for the next several days. Reiterated that he may call our on-call service for after hours questions or concerns if needed, otherwise we are always available to assist.  ? ?Georgie Chard NP-C ?Structural Heart Team  ?Pager: [phone] ?Phone: [phone] ?Office: [phone] ?

## 2021-03-07 NOTE — Progress Notes (Signed)
Spoke with Dr. Excell Seltzer about patient having 2 episodes of right groin swelling, pressure held 10 min and 20 min second time.  Dr. Excell Seltzer will come and assess groin, not tele needed. ?

## 2021-04-05 ENCOUNTER — Ambulatory Visit: Payer: BC Managed Care – PPO

## 2021-04-09 NOTE — Progress Notes (Signed)
?[facility]   ?Jimmy Kim ?                                    ?Cardiology Office Note:   ? ?Date:  04/10/2021  ? ?ID:  Jimmy Kim, DOB 29-Sep-1985, MRN [id number] ? ?PCP:  Jimmy Contras, MD  ?[facility] Cardiologist:  None  ?[place] Electrophysiologist:  None  ? ?Referring MD: Jimmy Contras, MD  ? ?Chief Complaint  ?Patient presents with  ? Follow-up  ?  1 month s/p PFO closure   ? ?History of Present Illness:   ? ?Jimmy Kim is a 36 y.o. male with a hx of PFO s/p closure with 18 mm Amplatzer PFO occluder, CVA, and asthma.  ? ?[name] was hospitalized 01/04/21 with a small right frontal stroke after presenting with right hand weakness and slurred speech. MRI confirmed a right frontal stroke. His evaluation showed no evidence of DVT. Hypercoagulable panel was essentially negative. He was found to have a moderate sized PFO on echo bubble imaging.  He returned to the emergency department with similar symptoms on 01/17/21 but his MRI showed resolution of his prior infarct and no new events. The patient was found to have no evidence of large vessel vascular disease. He underwent a transcranial Doppler study that showed Jimmy Kim grade 3 shunt indicative of a moderate sized PFO.  A transesophageal echo was performed demonstrating bidirectional shunting and positive bubble study at the level of the atrial septum consistent with a moderate PFO.  He was then referred to Dr. Burt Kim for PFO closure. This was performed 03/07/21. He tolerated the procedure well and was discharged the same day.  ? ?Medication recommendations include ASA/Clopidogrel x 6 months (09/07/2021) and SBE prophylaxis x 6 months. Will prescribe Amoxicillin 2g 1 hour prior to dental procedures, cleanings until 09/07/2021.  ? ?Today he presents for follow up and reports he has been doing very well with no new issues including chest pain, palpitations, neuro changes, dizziness, or syncope. He has been  tolerating medications well with no bleeding in stool or urine.  ? ?Past Medical History:  ?Diagnosis Date  ? Asthma   ? Stroke [facility])   ? ?Past Surgical History:  ?Procedure Laterality Date  ? BUBBLE STUDY  02/08/2021  ? Procedure: BUBBLE STUDY;  Surgeon: Buford Dresser, MD;  Location: [place];  Service: Cardiovascular;;  ? MANDIBLE SURGERY    ? PATENT FORAMEN OVALE(PFO) CLOSURE N/A 03/07/2021  ? Procedure: PATENT FORAMEN OVALE (PFO) CLOSURE;  Surgeon: Sherren Mocha, MD;  Location: [place] CV LAB;  Service: Cardiovascular;  Laterality: N/A;  ? TEE WITHOUT CARDIOVERSION N/A 02/08/2021  ? Procedure: TRANSESOPHAGEAL ECHOCARDIOGRAM (TEE);  Surgeon: Buford Dresser, MD;  Location: [place];  Service: Cardiovascular;  Laterality: N/A;  ? ? ?Current Medications: ?Current Meds  ?Medication Sig  ? acetaminophen (TYLENOL) 500 MG tablet Take 500 mg by mouth every 6 (six) hours as needed for moderate pain.  ? albuterol (VENTOLIN HFA) 108 (90 Base) MCG/ACT inhaler Inhale 2 puffs into the lungs every 6 (six) hours as needed for wheezing or shortness of breath.  ? amoxicillin (AMOXIL) 500 MG capsule Take 4 capsules (2,000 mg total) by mouth as directed. Take 4 tablets 1 hour prior to dental work, including cleanings.  ? Ascorbic Acid (VITAMIN C PO) Take 1 capsule by mouth as needed (immune boost).  ? aspirin 81 MG  chewable tablet Chew 1 tablet (81 mg total) by mouth daily.  ? clopidogrel (PLAVIX) 75 MG tablet Take 1 tablet (75 mg total) by mouth daily.  ? Melatonin 2.5 MG CHEW Chew 2.5 mg by mouth daily as needed (sleep).  ? mometasone (NASONEX) 50 MCG/ACT nasal spray Place 1 spray into the nose daily as needed (nasal congestion).  ?  ? ?Allergies:   Other  ? ?Social History  ? ?Socioeconomic History  ? Marital status: Married  ?  Spouse name: Not on file  ? Number of children: Not on file  ? Years of education: Not on file  ? Highest education level: Not on file  ?Occupational History  ? Not on file   ?Tobacco Use  ? Smoking status: Never  ?  Passive exposure: Never  ? Smokeless tobacco: Never  ?Vaping Use  ? Vaping Use: Never used  ?Substance and Sexual Activity  ? Alcohol use: Never  ? Drug use: Never  ? Sexual activity: Yes  ?  Birth control/protection: Condom  ?Other Topics Concern  ? Not on file  ?Social History Narrative  ? Not on file  ? ?Social Determinants of Health  ? ?Financial Resource Strain: Not on file  ?Food Insecurity: Not on file  ?Transportation Needs: Not on file  ?Physical Activity: Not on file  ?Stress: Not on file  ?Social Connections: Not on file  ?  ? ?Family History: ?The patient's family history includes Healthy in his father; Neurologic Disorder in his mother; Stroke in his paternal grandfather. ? ?ROS:   ?Please see the history of present illness.    ?All other systems reviewed and are negative. ? ?EKGs/Labs/Other Studies Reviewed:   ? ?The following studies were reviewed today: ? ?Echo limited 03/07/21: ? ? 1. Limited study for PFO  ? 2. Left ventricular ejection fraction, by estimation, is 55 to 60%. The  ?left ventricle has normal function.  ? 3. The atrial septum is thickened and echogenic, c/w 18 mm Amplatzer  ?device closure. No residual shunting is seen by color doppler.  ? ?PFO closure 03/07/21: ? ?Successful transcatheter PFO closure using an 18 mm Amplatzer PFO occluder using intracardiac echo and fluoroscopic guidance ?  ?Recommend: ?ASA/Clopidogrel x 6 months ?SBE prophylaxis x 6 months ?Same day DC protocol ? ?EKG:  EKG is not ordered today.   ? ?Recent Labs: ?02/05/2021: ALT 135 ?03/04/2021: BUN 13; Creatinine, Ser 0.95; Hemoglobin 14.2; Platelets 242; Potassium 4.4; Sodium 143  ? ?Recent Lipid Panel ?   ?Component Value Date/Time  ? CHOL 94 (L) 02/05/2021 1600  ? TRIG 46 02/05/2021 1600  ? HDL 36 (L) 02/05/2021 1600  ? CHOLHDL 2.6 02/05/2021 1600  ? CHOLHDL 4.3 01/05/2021 0204  ? VLDL 14 01/05/2021 0204  ? LDLCALC 46 02/05/2021 1600  ? ?Physical Exam:   ? ?VS:  BP 116/80    Pulse 73   Ht 6\' 1"  (1.854 m)   Wt 176 lb 6.4 oz (80 kg)   SpO2 99%   BMI 23.27 kg/m?    ? ?Wt Readings from Last 3 Encounters:  ?04/10/21 176 lb 6.4 oz (80 kg)  ?03/07/21 170 lb (77.1 kg)  ?03/01/21 175 lb (79.4 kg)  ? ?General: Well developed, well nourished, NAD ?Neck: Negative for carotid bruits. No JVD ?Lungs:Clear to ausculation bilaterally. No wheezes, rales, or rhonchi. Breathing is unlabored. ?Cardiovascular: RRR with S1 S2. No murmurs ?Extremities: No edema. ?Neuro: Alert and oriented. No focal deficits. No facial asymmetry. MAE spontaneously. ?Psych: Responds to  questions appropriately with normal affect.   ? ?ASSESSMENT/PLAN:   ? ?PFO: s/p PFO closure with an 18 mm Amplatzer PFO occluder using intracardiac echo and fluoroscopic guidance. Medication plan for ASA 81 and Plavix 75 until 09/07/2021, then ASA only. SBE with Amoxicillin 2g one hour prior to dental cleanings or procedures until 09/07/2021. Sent to preferred pharm. He is a research patient therefore will bring him back at the 6 month mark for registry. They will meet him here at his appointment.  ?Plan limited echocardiogram with bubble in 03/2022.  ? ?CVA: No new neuro changes. Follows with neuro soon.  ? ?Medication Adjustments/Labs and Tests Ordered: ?Current medicines are reviewed at length with the patient today.  Concerns regarding medicines are outlined above.  ?No orders of the defined types were placed in this encounter. ? ?Meds ordered this encounter  ?Medications  ? amoxicillin (AMOXIL) 500 MG capsule  ?  Sig: Take 4 capsules (2,000 mg total) by mouth as directed. Take 4 tablets 1 hour prior to dental work, including cleanings.  ?  Dispense:  4 capsule  ?  Refill:  0  ?  For 6 months only (until 09/07/2021)  ?  Order Specific Question:   Supervising Provider  ?  Answer:   COOPER, MICHAEL [id number]  ? ? ?Patient Instructions  ?Medication Instructions:  ?Discontinue Plavix on Saturday, September 2  ? ?*If you need a refill on your cardiac  medications before your next appointment, please call your pharmacy* ? ? ?Lab Work: ?None ordered  ? ?If you have labs (blood work) drawn today and your tests are completely normal, you will receive your results only by:

## 2021-04-10 ENCOUNTER — Ambulatory Visit (INDEPENDENT_AMBULATORY_CARE_PROVIDER_SITE_OTHER): Payer: 59 | Admitting: Cardiology

## 2021-04-10 ENCOUNTER — Encounter: Payer: Self-pay | Admitting: *Deleted

## 2021-04-10 VITALS — BP 116/80 | HR 73 | Ht 73.0 in | Wt 176.4 lb

## 2021-04-10 DIAGNOSIS — Q2112 Patent foramen ovale: Secondary | ICD-10-CM | POA: Diagnosis not present

## 2021-04-10 DIAGNOSIS — I639 Cerebral infarction, unspecified: Secondary | ICD-10-CM

## 2021-04-10 DIAGNOSIS — Z8774 Personal history of (corrected) congenital malformations of heart and circulatory system: Secondary | ICD-10-CM | POA: Diagnosis not present

## 2021-04-10 DIAGNOSIS — Z006 Encounter for examination for normal comparison and control in clinical research program: Secondary | ICD-10-CM

## 2021-04-10 MED ORDER — AMOXICILLIN 500 MG PO CAPS: 2000.0000 mg | ORAL_CAPSULE | ORAL | 0 refills | Status: DC

## 2021-04-10 NOTE — Research (Signed)
Saw patient today for 1 month office visit for PFO study. Patient is doing well overall and states he has no new issues. I will see patient at his 6 month office visit ? ? ?[place] Tarsha Blando ?Research Assistant 04/10/2021 08:15 a.m. ?

## 2021-04-10 NOTE — Patient Instructions (Signed)
Medication Instructions:  ?Discontinue Plavix on Saturday, September 2  ? ?*If you need a refill on your cardiac medications before your next appointment, please call your pharmacy* ? ? ?Lab Work: ?None ordered  ? ?If you have labs (blood work) drawn today and your tests are completely normal, you will receive your results only by: ?MyChart Message (if you have MyChart) OR ?A paper copy in the mail ?If you have any lab test that is abnormal or we need to change your treatment, we will call you to review the results. ? ? ?Testing/Procedures: ?None ordered  ? ? ?Follow-Up: ?Follow up as scheduled  ? ? ? ? ?Other Instructions ?None   ?

## 2021-04-12 ENCOUNTER — Telehealth: Payer: Self-pay | Admitting: Cardiovascular Disease

## 2021-04-12 MED ORDER — CLOPIDOGREL BISULFATE 75 MG PO TABS: 75.0000 mg | ORAL_TABLET | Freq: Every day | ORAL | 1 refills | Status: DC

## 2021-04-12 MED ORDER — ASPIRIN 81 MG PO CHEW: 81.0000 mg | CHEWABLE_TABLET | Freq: Every day | ORAL | 1 refills | Status: AC

## 2021-04-12 NOTE — Telephone Encounter (Signed)
Pt's medications were sent to pt's pharmacy as requested. Confirmation received.  

## 2021-04-12 NOTE — Telephone Encounter (Signed)
?*  STAT* If patient is at the pharmacy, call can be transferred to refill team. ? ? ?1. Which medications need to be refilled? (please list name of each medication and dose if known) clopidogrel (PLAVIX) 75 MG tablet ?Aspirin 81mg   ? ?2. Which pharmacy/location (including street and city if local pharmacy) is medication to be sent to? [id number] - [place], [place] - [address] ? ?3. Do they need a 30 day or 90 day supply? 90 day  ? ?Patient [place] leaving for the airport at 3pm.  ?

## 2021-05-21 ENCOUNTER — Telehealth: Payer: Self-pay | Admitting: Neurology

## 2021-05-21 NOTE — Telephone Encounter (Signed)
LVM and sent mychart msg informing pt of r/s needed for 6/26 appt- Dr. Sethi out. ?

## 2021-07-01 ENCOUNTER — Ambulatory Visit: Payer: BC Managed Care – PPO | Admitting: Neurology

## 2021-09-05 NOTE — Progress Notes (Signed)
[facility]   MULTIDISCIPLINARY HEART VALVE CLINIC                                     Cardiology Office Note:    Date:  09/06/2021   ID:  [name], DOB 1985/04/24, MRN [id number]  PCP:  Tally Joe, MD  [facility] Cardiologist:  None  [facility] Electrophysiologist:  None   Referring MD: Tally Joe, MD   6 month s/p PFO closure  History of Present Illness:    Jimmy Kim is a 36 y.o. male with a hx of PFO s/p closure with 18 mm Amplatzer PFO occluder, CVA, and asthma who presents to clinic for follow up.   Mr. Kohles was hospitalized 01/04/21 with a small right frontal stroke after presenting with right hand weakness and slurred speech. MRI confirmed a right frontal stroke. His evaluation showed no evidence of DVT. Hypercoagulable panel was essentially negative. He was found to have a moderate sized PFO on echo bubble imaging.  He returned to the emergency department with similar symptoms on 01/17/21 but his MRI showed resolution of his prior infarct and no new events. The patient was found to have no evidence of large vessel vascular disease. He underwent a transcranial Doppler study that showed Spencer grade 3 shunt indicative of a moderate sized PFO.  A transesophageal echo was performed demonstrating bidirectional shunting and positive bubble study at the level of the atrial septum consistent with a moderate PFO.  He was then referred to Dr. Excell Seltzer for PFO closure. This was performed 03/07/21. He tolerated the procedure well and was discharged the same day.   Medication recommendations include ASA/Clopidogrel x 6 months (09/07/2021) and SBE prophylaxis x 6 months.   Today he presents for follow up. No CP or SOB. No LE edema, orthopnea or PND. No dizziness or syncope. No blood in stool or urine. No palpitations. Does have some occasional numbness in lower lip. Does have a history of jaw surgery. 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     Past Medical History:  Diagnosis Date   Asthma    Stroke [facility])    Past Surgical History:  Procedure Laterality Date   BUBBLE STUDY  02/08/2021   Procedure: BUBBLE STUDY;  Surgeon: Jodelle Red, MD;  Location: [facility] ENDOSCOPY;  Service: Cardiovascular;;   MANDIBLE SURGERY     PATENT FORAMEN OVALE(PFO) CLOSURE N/A 03/07/2021   Procedure: PATENT FORAMEN OVALE (PFO) CLOSURE;  Surgeon: [name], MD;  Location: [facility] INVASIVE CV LAB;  Service: Cardiovascular;  Laterality: N/A;   TEE WITHOUT CARDIOVERSION N/A 02/08/2021   Procedure: TRANSESOPHAGEAL ECHOCARDIOGRAM (TEE);  Surgeon: Jodelle Red, MD;  Location: [facility] ENDOSCOPY;  Service: Cardiovascular;  Laterality: N/A;    Current Medications: Current Meds  Medication Sig   albuterol (VENTOLIN HFA) 108 (90 Base) MCG/ACT inhaler Inhale 2 puffs into the lungs every 6 (six) hours as needed for wheezing or shortness of breath.   amoxicillin (AMOXIL) 500 MG capsule Take 4 capsules (2,000 mg total) by mouth as directed. Take 4 tablets 1 hour prior to dental work, including cleanings.   Ascorbic Acid (VITAMIN C PO) Take 1 capsule by mouth as needed (immune boost).   aspirin 81 MG chewable tablet Chew 1 tablet (81 mg total) by mouth daily.   Melatonin 2.5 MG CHEW Chew 2.5 mg by mouth daily as needed (sleep).  mometasone (NASONEX) 50 MCG/ACT nasal spray Place 1 spray into the nose daily as needed (nasal congestion).     Allergies:   Other   Social History   Socioeconomic History   Marital status: Married    Spouse name: Not on file   Number of children: Not on file   Years of education: Not on file   Highest education level: Not on file  Occupational History   Not on file  Tobacco Use   Smoking status: Never    Passive exposure: Never   Smokeless tobacco: Never  Vaping Use   Vaping Use: Never used  Substance and Sexual Activity   Alcohol use: Never   Drug use: Never   Sexual activity: Yes    Birth control/protection:  Condom  Other Topics Concern   Not on file  Social History Narrative   Not on file   Social Determinants of Health   Financial Resource Strain: Not on file  Food Insecurity: Not on file  Transportation Needs: Not on file  Physical Activity: Not on file  Stress: Not on file  Social Connections: Not on file     Family History: The patient's family history includes Healthy in his father; Neurologic Disorder in his mother; Stroke in his paternal grandfather.  ROS:   Please see the history of present illness.    All other systems reviewed and are negative.  EKGs/Labs/Other Studies Reviewed:    The following studies were reviewed today:  Echo limited 03/07/21:   1. Limited study for PFO   2. Left ventricular ejection fraction, by estimation, is 55 to 60%. The  left ventricle has normal function.   3. The atrial septum is thickened and echogenic, c/w 18 mm Amplatzer  device closure. No residual shunting is seen by color doppler.   ______________________  PFO closure 03/07/21:  Successful transcatheter PFO closure using an 18 mm Amplatzer PFO occluder using intracardiac echo and fluoroscopic guidance   Recommend: ASA/Clopidogrel x 6 months SBE prophylaxis x 6 months Same day DC protocol  EKG:  EKG is not ordered today.    Recent Labs: 02/05/2021: ALT 135 03/04/2021: BUN 13; Creatinine, Ser 0.95; Hemoglobin 14.2; Platelets 242; Potassium 4.4; Sodium 143   Recent Lipid Panel    Component Value Date/Time   CHOL 94 (L) 02/05/2021 1600   TRIG 46 02/05/2021 1600   HDL 36 (L) 02/05/2021 1600   CHOLHDL 2.6 02/05/2021 1600   CHOLHDL 4.3 01/05/2021 0204   VLDL 14 01/05/2021 0204   LDLCALC 46 02/05/2021 1600   Physical Exam:    VS:  BP (!) 102/54   Pulse 85   Ht 6\' 1"  (1.854 m)   Wt 174 lb 6.4 oz (79.1 kg)   SpO2 98%   BMI 23.01 kg/m     Wt Readings from Last 3 Encounters:  09/06/21 174 lb 6.4 oz (79.1 kg)  04/10/21 176 lb 6.4 oz (80 kg)  03/07/21 170 lb (77.1 kg)    General: Well developed, well nourished, NAD Neck: Negative for carotid bruits. No JVD Lungs:Clear to ausculation bilaterally. No wheezes, rales, or rhonchi. Breathing is unlabored. Cardiovascular: RRR with S1 S2. No murmurs Extremities: No edema. Neuro: Alert and oriented. No focal deficits. No facial asymmetry. MAE spontaneously. Psych: Responds to questions appropriately with normal affect.    ASSESSMENT/PLAN:    PFO s/p PFO closure: doing well. He can stop plavix at this time and continue on baby aspirin. He no longer needs SBE prophylaxis after 6 months  out from device placement. Plan limited echocardiogram with bubble with follow up in 03/2022.   CVA: No new neuro changes. Follows with Dr Pearlean Brownie in November.   Medication Adjustments/Labs and Tests Ordered: Current medicines are reviewed at length with the patient today.  Concerns regarding medicines are outlined above.  No orders of the defined types were placed in this encounter.  No orders of the defined types were placed in this encounter.   There are no Patient Instructions on file for this visit.   Signed, Cline Crock, PA-C  09/06/2021 11:41 AM    [facility]

## 2021-09-06 ENCOUNTER — Encounter: Payer: 59 | Admitting: *Deleted

## 2021-09-06 ENCOUNTER — Ambulatory Visit: Payer: No Typology Code available for payment source | Attending: Physician Assistant | Admitting: Physician Assistant

## 2021-09-06 VITALS — BP 102/54 | HR 85 | Ht 73.0 in | Wt 174.4 lb

## 2021-09-06 DIAGNOSIS — Z8774 Personal history of (corrected) congenital malformations of heart and circulatory system: Secondary | ICD-10-CM

## 2021-09-06 DIAGNOSIS — Z8673 Personal history of transient ischemic attack (TIA), and cerebral infarction without residual deficits: Secondary | ICD-10-CM | POA: Diagnosis not present

## 2021-09-06 DIAGNOSIS — Z006 Encounter for examination for normal comparison and control in clinical research program: Secondary | ICD-10-CM

## 2021-09-06 NOTE — Patient Instructions (Signed)
Medication Instructions:  Your physician recommends that you continue on your current medications as directed. Please refer to the Current Medication list given to you today.  *If you need a refill on your cardiac medications before your next appointment, please call your pharmacy*   Lab Work: None ordered today   If you have labs (blood work) drawn today and your tests are completely normal, you will receive your results only by: MyChart Message (if you have MyChart) OR A paper copy in the mail If you have any lab test that is abnormal or we need to change your treatment, we will call you to review the results.   Testing/Procedures: Your physician has requested that you have an bubble echocardiogram in 1 year. Echocardiography is a painless test that uses sound waves to create images of your heart. It provides your doctor with information about the size and shape of your heart and how well your heart's chambers and valves are working. This procedure takes approximately one hour. There are no restrictions for this procedure.    Follow-Up: Follow up as scheduled   Other Instructions   Important Information About Sugar

## 2021-09-06 NOTE — Research (Signed)
Saw patient for his 6 month follow up for PFO study. Patient states he is doing well but has had some numbness on the right side of his mouth. I told him to make sure that he lets the PA know that when she comes in to see him.     [place] Oleda Borski, Research Coordinator  09/06/2021  11:45

## 2021-11-21 ENCOUNTER — Encounter: Payer: Self-pay | Admitting: Neurology

## 2021-11-21 ENCOUNTER — Ambulatory Visit (INDEPENDENT_AMBULATORY_CARE_PROVIDER_SITE_OTHER): Payer: No Typology Code available for payment source | Admitting: Neurology

## 2021-11-21 VITALS — BP 118/80 | HR 60 | Ht 73.0 in | Wt 171.8 lb

## 2021-11-21 DIAGNOSIS — Q2112 Patent foramen ovale: Secondary | ICD-10-CM | POA: Diagnosis not present

## 2021-11-21 DIAGNOSIS — I639 Cerebral infarction, unspecified: Secondary | ICD-10-CM | POA: Diagnosis not present

## 2021-11-21 NOTE — Patient Instructions (Signed)
I had a long d/w patient about his cryptogenic stroke and recent PFO closure, risk for recurrent stroke/TIAs, personally independently reviewed imaging studies and stroke evaluation results and answered questions.Continue aspirin 81 mg daily  for secondary stroke prevention and maintain strict control of hypertension with blood pressure goal below 130/90, diabetes with hemoglobin A1c goal below 6.5% and lipids with LDL cholesterol goal below 70 mg/dL. I also advised the patient to eat a healthy diet with plenty of whole grains, cereals, fruits and vegetables, exercise regularly and maintain ideal body weight .check follow-up lipid profile and liver function tests as well as transcranial Doppler bubble study to look for adequacy of PFO closure.  Followup in the future with me only as needed and no scheduled appointment was made.

## 2021-11-21 NOTE — Progress Notes (Signed)
[facility] [address] [place]. [place] [address] [phone]       OFFICE FOLLOW-UP VISIT NOTE  Mr. SHOWN DISSINGER Date of Birth:  [birth year] Medical Record Number:  [id number]   Referring MD: Gevena Barre  Reason for Referral: Stroke and PFO  HPI: Initial visit 01/28/2021 Mr. Liberati is a pleasant 36 year old Caucasian male seen today for initial office consultation visit for stroke and PFO.  History is obtained from the patient and review of electronic medical records and opossum reviewed available pertinent imaging films in PACS.  No significant past medical history except asthma.  He presented on 01/04/2021 with sudden onset of uncontrollable involuntary movements of the right arm which were breathing and difficult to stop.  This lasted barely a minute and stopped.  He also noticed some trouble speaking and had no control of his voice and tongue and speech was slurred.  He states he had somewhat similar episode about a month ago in which she had difficulty controlling his left upper extremity for less than a minute around Thanksgiving weekend.  He had another episode roughly a week later.  Patient did complain of a headache during 1 of these episodes.  The night prior to his episode on December 30 he had not slept well and woke up in the morning with a severe headache and then went back to sleep for an hour and a half and then woke up and about 10 minutes after waking up he had this episode.  Patient had an MRI scan of the brain which showed a linear area of restricted diffusion in the left frontal precentral gyrus with corresponding dark signal on the ADC map and extending into the centrum semiovale consistent with an acute infarct.  CT angiogram of the brain and neck showed no significant large vessel stenosis or occlusion.  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  LDL cholesterol is  elevated 146 mg percent.  Hemoglobin A1c was 5.3.  ESR was 4 mm.  EEG was negative for any seizure activity.  Hypercoagulable panel labs were mostly unremarkable except for slight elevation of IgM anticardiolipin antibody of indeterminate titer.  Lower extremity venous Dopplers were negative for DVT.  TEE has been scheduled but not yet been done.  Patient was started on dual antiplatelet therapy aspirin and Plavix and on Lipitor 40 mg daily.  He is tolerating these medications well without significant bruising or bleeding.  He does complain of some muscle aches and pains.  Patient denies any prior history of DVT, pulmonary embolism.  He does have history of stroke in his grandfather at age 49 but he does not know any details.  Patient is scheduled to see Dr. Burt Knack to discuss endovascular PFO closure.  The patient does admit to having flown to [facility] for a CME conference towards the end of October and had 10 hours of sitting in classrooms for that meeting.  He denied however leg pain swelling or discomfort.  There is no family history of DVT or pulmonary embolism.  He denies known history of migraines but on inquiry states that he does get infrequent headaches and at times has to take ibuprofen with good relief.  On one occasion he remembers seeing some light sensitivity and visual spots and had to sit down and rest.  He is working as a Pharmacist, community at [employer].  Denies smoking cigarettes or abusing alcohol or doing street  drugs. Update 11/21/2021 : He returns for follow-up after last visit with me in January 2023.  He states he is doing well.  He has had no recurrent stroke or TIA symptoms..  TCD bubble study on 02/15/2021 which confirmed a moderate size right to left shunt and subsequently he had endovascular PFO closure by Dr. Burt Knack on 03/07/2021 which went well without complications.  He was on aspirin and Plavix for 3 months and is now aspirin alone.  He has had no recurrent stroke or TIA symptoms.  He  is back headache episodes history of the closure.  Woke on 02/05/2021 showed LDL cholesterol 50.6 mg.  Was found to have elevated liver enzymes and was asked to stop his statin by his physician.  He however has not had any follow-up lipid profile liver enzymes checked yet. ROS:   14 system review of systems is positive for involuntary movements, slurred speech, numbness, tingling all other systems negative  PMH:  Past Medical History:  Diagnosis Date   Asthma    Stroke [facility])     Social History:  Social History   Socioeconomic History   Marital status: Married    Spouse name: Not on file   Number of children: Not on file   Years of education: Not on file   Highest education level: Not on file  Occupational History   Not on file  Tobacco Use   Smoking status: Never    Passive exposure: Never   Smokeless tobacco: Never  Vaping Use   Vaping Use: Never used  Substance and Sexual Activity   Alcohol use: Never   Drug use: Never   Sexual activity: Yes    Birth control/protection: Condom  Other Topics Concern   Not on file  Social History Narrative   Not on file   Social Determinants of Health   Financial Resource Strain: Not on file  Food Insecurity: Not on file  Transportation Needs: Not on file  Physical Activity: Not on file  Stress: Not on file  Social Connections: Not on file  Intimate Partner Violence: Not on file    Medications:   Current Outpatient Medications on File Prior to Visit  Medication Sig Dispense Refill   albuterol (VENTOLIN HFA) 108 (90 Base) MCG/ACT inhaler Inhale 2 puffs into the lungs every 6 (six) hours as needed for wheezing or shortness of breath.     amoxicillin (AMOXIL) 500 MG capsule Take 4 capsules (2,000 mg total) by mouth as directed. Take 4 tablets 1 hour prior to dental work, including cleanings. 4 capsule 0   Ascorbic Acid (VITAMIN C PO) Take 1 capsule by mouth as needed (immune boost).     aspirin 81 MG chewable tablet Chew 1 tablet  (81 mg total) by mouth daily. 90 tablet 1   Melatonin 2.5 MG CHEW Chew 2.5 mg by mouth daily as needed (sleep).     mometasone (NASONEX) 50 MCG/ACT nasal spray Place 1 spray into the nose daily as needed (nasal congestion).     No current facility-administered medications on file prior to visit.    Allergies:   Allergies  Allergen Reactions   Other Other (See Comments)    Quinoa - upset stomach    Physical Exam General: Pleasant young Caucasian male well developed, well nourished, seated, in no evident distress Head: head normocephalic and atraumatic.   Neck: supple with no carotid or supraclavicular bruits Cardiovascular: regular rate and rhythm, no murmurs Musculoskeletal: no deformity Skin:  no rash/petichiae Vascular:  Normal pulses all extremities  Neurologic Exam Mental Status: Awake and fully alert. Oriented to place and time. Recent and remote memory intact. Attention span, concentration and fund of knowledge appropriate. Mood and affect appropriate.  Cranial Nerves: Fundoscopic exam not done. Pupils equal, briskly reactive to light. Extraocular movements full without nystagmus.  Mild esotropia of the left eye.  Visual fields full to confrontation. Hearing intact. Facial sensation intact. Face, tongue, palate moves normally and symmetrically.  Motor: Normal bulk and tone. Normal strength in all tested extremity muscles. Sensory.: intact to touch , pinprick , position and vibratory sensation.  Coordination: Rapid alternating movements normal in all extremities. Finger-to-nose and heel-to-shin performed accurately bilaterally. Gait and Station: Arises from chair without difficulty. Stance is normal. Gait demonstrates normal stride length and balance . Able to heel, toe and tandem walk without difficulty.  Reflexes: 1+ and symmetric. Toes downgoing.      ASSESSMENT: 36 year old Caucasian male with transient episode of right upper extremity involuntary movements with slurred  speech with abnormal MRI scan likely a cryptogenic stroke in December 2022.  Interestingly somewhat similar episodes involving the left upper extremity a month prior also lasting less than a minute.  No significant vascular risk factors except mild hyperlipidemia and PFO with high risk features.  He also has episodes suggestive of complicated migraines.  He underwent successful endovascular PFO closure on 03/08/2021 and has done well.     PLAN: I had a long d/w patient about his cryptogenic stroke and recent PFO closure, risk for recurrent stroke/TIAs, personally independently reviewed imaging studies and stroke evaluation results and answered questions.Continue aspirin 81 mg daily  for secondary stroke prevention and maintain strict control of hypertension with blood pressure goal below 130/90, diabetes with hemoglobin A1c goal below 6.5% and lipids with LDL cholesterol goal below 70 mg/dL. I also advised the patient to eat a healthy diet with plenty of whole grains, cereals, fruits and vegetables, exercise regularly and maintain ideal body weight .check follow-up lipid profile and liver function tests as well as transcranial Doppler bubble study to look for adequacy of PFO closure.  Followup in the future with me only as needed and no scheduled appointment was made.  Greater than 50% time during this prolonged 35-minute visit was spent on counseling and coordination of care about his cryptogenic stroke, strokelike episode, complicated migraines and PFO and discussion about risk benefit of PFO closure and answering questions Antony Contras, MD  Note: This document was prepared with digital dictation and possible smart phrase technology. Any transcriptional errors that result from this process are unintentional.

## 2021-11-22 ENCOUNTER — Other Ambulatory Visit: Payer: Self-pay | Admitting: Neurology

## 2021-11-22 LAB — COMPREHENSIVE METABOLIC PANEL
ALT: 26 IU/L (ref 0–44)
AST: 19 IU/L (ref 0–40)
Albumin/Globulin Ratio: 2.1 (ref 1.2–2.2)
Albumin: 4.6 g/dL (ref 4.1–5.1)
Alkaline Phosphatase: 84 IU/L (ref 44–121)
BUN/Creatinine Ratio: 17 (ref 9–20)
BUN: 16 mg/dL (ref 6–20)
Bilirubin Total: 0.5 mg/dL (ref 0.0–1.2)
CO2: 26 mmol/L (ref 20–29)
Calcium: 9.2 mg/dL (ref 8.7–10.2)
Chloride: 101 mmol/L (ref 96–106)
Creatinine, Ser: 0.93 mg/dL (ref 0.76–1.27)
Globulin, Total: 2.2 g/dL (ref 1.5–4.5)
Glucose: 86 mg/dL (ref 70–99)
Potassium: 4.3 mmol/L (ref 3.5–5.2)
Sodium: 139 mmol/L (ref 134–144)
Total Protein: 6.8 g/dL (ref 6.0–8.5)
eGFR: 111 mL/min/{1.73_m2} (ref >59)

## 2021-11-22 LAB — LIPID PANEL
Chol/HDL Ratio: 3.1 ratio (ref 0.0–5.0)
Cholesterol, Total: 183 mg/dL (ref 100–199)
HDL: 59 mg/dL (ref >39)
LDL Chol Calc (NIH): 114 mg/dL — ABNORMAL HIGH (ref 0–99)
Triglycerides: 53 mg/dL (ref 0–149)
VLDL Cholesterol Cal: 10 mg/dL (ref 5–40)

## 2021-11-22 MED ORDER — ATORVASTATIN CALCIUM 40 MG PO TABS: 40.0000 mg | ORAL_TABLET | Freq: Every day | ORAL | 3 refills | Status: DC

## 2021-11-22 NOTE — Progress Notes (Signed)
Kindly inform the patient that lipid profile shows bad cholesterol is elevated and I recommend he start taking atorvastatin 40 mg daily and has follow-up lipid profile checked in 2 to 3 months.  I have sent the prescription to his listed pharmacy.  Rest of the labs look unremarkable

## 2021-11-25 ENCOUNTER — Telehealth: Payer: Self-pay

## 2021-11-25 NOTE — Telephone Encounter (Signed)
-----   Message from Micki Riley, MD sent at 11/22/2021  8:57 AM EST ----- Kindly inform the patient that lipid profile shows bad cholesterol is elevated and I recommend he start taking atorvastatin 40 mg daily and has follow-up lipid profile checked in 2 to 3 months.  I have sent the prescription to his listed pharmacy.  Rest of the labs look unremarkable

## 2021-11-25 NOTE — Telephone Encounter (Signed)
Attempted to call pt, LVM for results per DPR. °Ask pt to call back for questions or concerns.  °

## 2021-12-05 ENCOUNTER — Telehealth: Payer: Self-pay | Admitting: Nurse Practitioner

## 2021-12-05 NOTE — Telephone Encounter (Signed)
   Pt called this evening noting that over the past 20 mins, he has been having intermittent palpitations described as a brief tremor in his chest, possibly assoc w/ mild chest tightness, though he isn't sure if his chest is really tight or not.  He is clear that he is in no distress at this time, just wondering what his heart might be doing.  We discussed that if symptoms persist or worsen, he should seek medical attention in the ED.  We also discussed potentially submitting a rhythm strip through mychart.  A relative has an apple watch, and he's going to see if he can capture a strip during symptoms and upload for evaluation by our staff in the AM.  Nicolasa Ducking, NP 12/05/2021, 7:13 PM

## 2022-03-07 ENCOUNTER — Other Ambulatory Visit: Payer: Self-pay | Admitting: Cardiology

## 2022-03-07 DIAGNOSIS — Q2112 Patent foramen ovale: Secondary | ICD-10-CM

## 2022-03-07 DIAGNOSIS — I639 Cerebral infarction, unspecified: Secondary | ICD-10-CM

## 2022-03-10 ENCOUNTER — Other Ambulatory Visit: Payer: Self-pay | Admitting: Cardiology

## 2022-03-10 DIAGNOSIS — Q2112 Patent foramen ovale: Secondary | ICD-10-CM

## 2022-03-11 ENCOUNTER — Other Ambulatory Visit (HOSPITAL_COMMUNITY): Payer: No Typology Code available for payment source

## 2022-03-12 ENCOUNTER — Other Ambulatory Visit (HOSPITAL_COMMUNITY): Payer: No Typology Code available for payment source

## 2022-03-12 ENCOUNTER — Ambulatory Visit: Payer: No Typology Code available for payment source

## 2022-04-17 NOTE — Progress Notes (Signed)
[facility]   MULTIDISCIPLINARY HEART VALVE CLINIC                                     Cardiology Office Note:    Date:  04/18/2022   ID:  Jimmy Kim, DOB [birth year], MRN [id number]  PCP:  Jimmy Joe, MD  [facility] Cardiologist: Dr. Excell Seltzer, MD (PFO)  [facility] Electrophysiologist:  None   Referring MD: Jimmy Joe, MD   Chief Complaint  Patient presents with   Follow-up    1 year s/p PFO   History of Present Illness:    Jimmy Kim is a 37 y.o. male with a hx of PFO s/p closure with 18 mm Amplatzer PFO occluder, CVA, and asthma who presents to clinic for one year follow up.    Mr. Nemetz was hospitalized 01/04/21 with a small right frontal stroke after presenting with right hand weakness and slurred speech. MRI confirmed a right frontal stroke. His evaluation showed no evidence of DVT. Hypercoagulable panel was essentially negative. He was found to have a moderate sized PFO on echo bubble imaging.  He returned to the emergency department with similar symptoms on 01/17/21 but his MRI showed resolution of his prior infarct and no new events. The patient was found to have no evidence of large vessel vascular disease. He underwent a transcranial Doppler study that showed Spencer grade 3 shunt indicative of a moderate sized PFO.  A transesophageal echo was performed demonstrating bidirectional shunting and positive bubble study at the level of the atrial septum consistent with a moderate PFO.  He was then referred to Dr. Excell Seltzer for PFO closure. This was performed 03/07/21. He tolerated the procedure well and was discharged the same day.    Medication recommendations include ASA/Clopidogrel x 6 months (09/07/2021) and SBE prophylaxis x 6 months.    He has done well since implant and presents today for one year evaluation. He denies SOB, neuro changes, dizziness, or syncope. He reports an occasional fleeting chest "twinge" but no other symptoms of implant.   Past  Medical History:  Diagnosis Date   Asthma    Stroke     Past Surgical History:  Procedure Laterality Date   BUBBLE STUDY  02/08/2021   Procedure: BUBBLE STUDY;  Surgeon: Jimmy Red, MD;  Location: [facility] ENDOSCOPY;  Service: Cardiovascular;;   MANDIBLE SURGERY     PATENT FORAMEN OVALE(PFO) CLOSURE N/A 03/07/2021   Procedure: PATENT FORAMEN OVALE (PFO) CLOSURE;  Surgeon: Jimmy Bollman, MD;  Location: [facility] INVASIVE CV LAB;  Service: Cardiovascular;  Laterality: N/A;   TEE WITHOUT CARDIOVERSION N/A 02/08/2021   Procedure: TRANSESOPHAGEAL ECHOCARDIOGRAM (TEE);  Surgeon: Jimmy Red, MD;  Location: [facility] ENDOSCOPY;  Service: Cardiovascular;  Laterality: N/A;    Current Medications: Current Meds  Medication Sig   albuterol (VENTOLIN HFA) 108 (90 Base) MCG/ACT inhaler Inhale 2 puffs into the lungs every 6 (six) hours as needed for wheezing or shortness of breath.   Ascorbic Acid (VITAMIN C PO) Take 1 capsule by mouth as needed (immune boost).   aspirin 81 MG chewable tablet Chew 1 tablet (81 mg total) by mouth daily.   Melatonin 2.5 MG CHEW Chew 2.5 mg by mouth daily as needed (sleep).   mometasone (NASONEX) 50 MCG/ACT nasal spray Place 1 spray into the nose daily as needed (nasal congestion).   [DISCONTINUED] amoxicillin (AMOXIL) 500 MG capsule Take 4 capsules (  2,000 mg total) by mouth as directed. Take 4 tablets 1 hour prior to dental work, including cleanings.   [DISCONTINUED] atorvastatin (LIPITOR) 40 MG tablet Take 1 tablet (40 mg total) by mouth daily.     Allergies:   Other   Social History   Socioeconomic History   Marital status: Married    Spouse name: Not on file   Number of children: Not on file   Years of education: Not on file   Highest education level: Not on file  Occupational History   Not on file  Tobacco Use   Smoking status: Never    Passive exposure: Never   Smokeless tobacco: Never  Vaping Use   Vaping Use: Never used  Substance and Sexual  Activity   Alcohol use: Never   Drug use: Never   Sexual activity: Yes    Birth control/protection: Condom  Other Topics Concern   Not on file  Social History Narrative   Not on file   Social Determinants of Health   Financial Resource Strain: Not on file  Food Insecurity: Not on file  Transportation Needs: Not on file  Physical Activity: Not on file  Stress: Not on file  Social Connections: Not on file     Family History: The patient's family history includes Healthy in his father; Neurologic Disorder in his mother; Stroke in his paternal grandfather.  ROS:   Please see the history of present illness.    All other systems reviewed and are negative.  EKGs/Labs/Other Studies Reviewed:    The following studies were reviewed today:  Limited echo 04/18/22:  1. S/P ASD repair with no evidence of residual shunting by colorflow  doppler. Patient delinced bubble study.    Echo limited 03/07/21:   1. Limited study for PFO   2. Left ventricular ejection fraction, by estimation, is 55 to 60%. The  left ventricle has normal function.   3. The atrial septum is thickened and echogenic, c/w 18 mm Amplatzer  device closure. No residual shunting is seen by color doppler.   _____________________   PFO closure 03/07/21:   Successful transcatheter PFO closure using an 18 mm Amplatzer PFO occluder using intracardiac echo and fluoroscopic guidance   Recommend: ASA/Clopidogrel x 6 months SBE prophylaxis x 6 months Same day DC protocol    EKG:  EKG is not ordered today.   Recent Labs: 11/21/2021: ALT 26; BUN 16; Creatinine, Ser 0.93; Potassium 4.3; Sodium 139   Recent Lipid Panel    Component Value Date/Time   CHOL 183 11/21/2021 1315   TRIG 53 11/21/2021 1315   HDL 59 11/21/2021 1315   CHOLHDL 3.1 11/21/2021 1315   CHOLHDL 4.3 01/05/2021 0204   VLDL 14 01/05/2021 0204   LDLCALC 114 (H) 11/21/2021 1315   Physical Exam:    VS:  BP 104/76   Pulse 75   Ht 6\' 1"  (1.854 m)    Wt 173 lb 9.6 oz (78.7 kg)   SpO2 99%   BMI 22.90 kg/m     Wt Readings from Last 3 Encounters:  04/18/22 173 lb 9.6 oz (78.7 kg)  11/21/21 171 lb 12.8 oz (77.9 kg)  09/06/21 174 lb 6.4 oz (79.1 kg)    General: Well developed, well nourished, NAD Lungs:Clear to ausculation bilaterally. No wheezes, rales, or rhonchi. Breathing is unlabored. Cardiovascular: RRR with S1 S2. No murmurs Extremities: No edema.  Neuro: Alert and oriented. No focal deficits. No facial asymmetry. MAE spontaneously. Psych: Responds to questions appropriately with  normal affect.    ASSESSMENT/PLAN:    PFO s/p PFO closure: Doing well since implant. Continue ASA. No need for dental SBE any longer given duration since PFO closure. Echo today with no residual shunting by colorflow. Bubble study not performed out of patients preference.Dr. Excell Seltzerooper aware. Can follow cardiology PRN.   CVA: No new neuro changes.   Medication Adjustments/Labs and Tests Ordered: Current medicines are reviewed at length with the patient today.  Concerns regarding medicines are outlined above.  No orders of the defined types were placed in this encounter.  No orders of the defined types were placed in this encounter.   Patient Instructions  Medication Instructions:  Your physician has recommended you make the following change in your medication:  STOP TAKING AMOXICILLIN   *If you need a refill on your cardiac medications before your next appointment, please call your pharmacy*   Lab Work: NONE If you have labs (blood work) drawn today and your tests are completely normal, you will receive your results only by: MyChart Message (if you have MyChart) OR A paper copy in the mail If you have any lab test that is abnormal or we need to change your treatment, we will call you to review the results.   Testing/Procedures: NONE   Follow-Up: At [facility], you and your health needs are our priority.  As part of our  continuing mission to provide you with exceptional heart care, we have created designated Provider Care Teams.  These Care Teams include your primary Cardiologist (physician) and Advanced Practice Providers (APPs -  Physician Assistants and Nurse Practitioners) who all work together to provide you with the care you need, when you need it.  We recommend signing up for the patient portal called "MyChart".  Sign up information is provided on this After Visit Summary.  MyChart is used to connect with patients for Virtual Visits (Telemedicine).  Patients are able to view lab/test results, encounter notes, upcoming appointments, etc.  Non-urgent messages can be sent to your provider as well.   To learn more about what you can do with MyChart, go to [email or web address].    Your next appointment:   AS NEEDED    Signed, Georgie ChardJill Raydon Chappuis, NP  04/18/2022 3:02 PM    [facility]

## 2022-04-18 ENCOUNTER — Ambulatory Visit (HOSPITAL_COMMUNITY): Payer: No Typology Code available for payment source | Attending: Internal Medicine

## 2022-04-18 ENCOUNTER — Ambulatory Visit (INDEPENDENT_AMBULATORY_CARE_PROVIDER_SITE_OTHER): Payer: No Typology Code available for payment source | Admitting: Cardiology

## 2022-04-18 VITALS — BP 104/76 | HR 75 | Ht 73.0 in | Wt 173.6 lb

## 2022-04-18 DIAGNOSIS — I639 Cerebral infarction, unspecified: Secondary | ICD-10-CM | POA: Diagnosis present

## 2022-04-18 DIAGNOSIS — Q2112 Patent foramen ovale: Secondary | ICD-10-CM | POA: Diagnosis present

## 2022-04-18 LAB — ECHOCARDIOGRAM LIMITED
Height: 73 in
Weight: 2777.6 oz

## 2022-04-18 NOTE — Patient Instructions (Signed)
Medication Instructions:  Your physician has recommended you make the following change in your medication:  STOP TAKING AMOXICILLIN   *If you need a refill on your cardiac medications before your next appointment, please call your pharmacy*   Lab Work: NONE If you have labs (blood work) drawn today and your tests are completely normal, you will receive your results only by: MyChart Message (if you have MyChart) OR A paper copy in the mail If you have any lab test that is abnormal or we need to change your treatment, we will call you to review the results.   Testing/Procedures: NONE   Follow-Up: At [facility], you and your health needs are our priority.  As part of our continuing mission to provide you with exceptional heart care, we have created designated Provider Care Teams.  These Care Teams include your primary Cardiologist (physician) and Advanced Practice Providers (APPs -  Physician Assistants and Nurse Practitioners) who all work together to provide you with the care you need, when you need it.  We recommend signing up for the patient portal called "MyChart".  Sign up information is provided on this After Visit Summary.  MyChart is used to connect with patients for Virtual Visits (Telemedicine).  Patients are able to view lab/test results, encounter notes, upcoming appointments, etc.  Non-urgent messages can be sent to your provider as well.   To learn more about what you can do with MyChart, go to [email or web address].    Your next appointment:   AS NEEDED

## 2022-04-23 ENCOUNTER — Encounter: Payer: Self-pay | Admitting: *Deleted

## 2022-04-23 DIAGNOSIS — Z006 Encounter for examination for normal comparison and control in clinical research program: Secondary | ICD-10-CM

## 2022-05-29 NOTE — Research (Signed)
12 Month Pfo Follow up  Patient seen on 04-18-2022. Patient states he is doing well since implant. Echo performed as part of protocol for the study. All remaining visits will be phone calls only.      [place] Christon Gallaway, Research Coordinator

## 2023-03-09 ENCOUNTER — Other Ambulatory Visit (HOSPITAL_COMMUNITY): Payer: No Typology Code available for payment source

## 2023-03-09 ENCOUNTER — Ambulatory Visit: Payer: No Typology Code available for payment source

## 2023-05-01 ENCOUNTER — Encounter: Payer: Self-pay | Admitting: *Deleted

## 2023-05-01 DIAGNOSIS — Z006 Encounter for examination for normal comparison and control in clinical research program: Secondary | ICD-10-CM

## 2023-05-01 NOTE — Research (Signed)
 Called patient today for 2 year phone call for PFO study patient states he is doing well having no problems since closure. No symptoms like he had before his stroke. Told patient  I  will call him next year.     [place] Han Vejar, Research Coordinator
